# Patient Record
Sex: Female | Born: 1955 | Race: White | Hispanic: No | Marital: Married | Smoking: Never smoker
Health system: Southern US, Community
[De-identification: ages and names within clinical notes are randomized; demographics above are authoritative.]

## PROBLEM LIST (undated history)

## (undated) DIAGNOSIS — N393 Stress incontinence (female) (male): Secondary | ICD-10-CM

## (undated) HISTORY — DX: Stress incontinence (female) (male): N39.3

---

## 1997-08-15 ENCOUNTER — Ambulatory Visit (HOSPITAL_COMMUNITY): Admission: RE | Admit: 1997-08-15 | Discharge: 1997-08-15 | Payer: Self-pay | Admitting: Obstetrics and Gynecology

## 1998-07-22 ENCOUNTER — Other Ambulatory Visit: Admission: RE | Admit: 1998-07-22 | Discharge: 1998-07-22 | Payer: Self-pay | Admitting: Obstetrics and Gynecology

## 1999-08-13 ENCOUNTER — Other Ambulatory Visit: Admission: RE | Admit: 1999-08-13 | Discharge: 1999-08-13 | Payer: Self-pay | Admitting: Obstetrics and Gynecology

## 1999-10-01 ENCOUNTER — Ambulatory Visit (HOSPITAL_COMMUNITY): Admission: RE | Admit: 1999-10-01 | Discharge: 1999-10-01 | Payer: Self-pay | Admitting: Obstetrics and Gynecology

## 1999-10-01 ENCOUNTER — Encounter: Payer: Self-pay | Admitting: Obstetrics and Gynecology

## 2000-08-19 ENCOUNTER — Other Ambulatory Visit: Admission: RE | Admit: 2000-08-19 | Discharge: 2000-08-19 | Payer: Self-pay | Admitting: Obstetrics and Gynecology

## 2001-08-22 ENCOUNTER — Other Ambulatory Visit: Admission: RE | Admit: 2001-08-22 | Discharge: 2001-08-22 | Payer: Self-pay | Admitting: Obstetrics and Gynecology

## 2002-08-01 ENCOUNTER — Ambulatory Visit (HOSPITAL_COMMUNITY): Admission: RE | Admit: 2002-08-01 | Discharge: 2002-08-01 | Payer: Self-pay | Admitting: Obstetrics and Gynecology

## 2002-08-01 ENCOUNTER — Encounter: Payer: Self-pay | Admitting: Obstetrics and Gynecology

## 2002-09-15 ENCOUNTER — Other Ambulatory Visit: Admission: RE | Admit: 2002-09-15 | Discharge: 2002-09-15 | Payer: Self-pay | Admitting: Obstetrics and Gynecology

## 2003-10-22 ENCOUNTER — Other Ambulatory Visit: Admission: RE | Admit: 2003-10-22 | Discharge: 2003-10-22 | Payer: Self-pay | Admitting: Obstetrics and Gynecology

## 2003-10-26 ENCOUNTER — Ambulatory Visit (HOSPITAL_COMMUNITY): Admission: RE | Admit: 2003-10-26 | Discharge: 2003-10-26 | Payer: Self-pay | Admitting: Obstetrics and Gynecology

## 2004-11-03 ENCOUNTER — Other Ambulatory Visit: Admission: RE | Admit: 2004-11-03 | Discharge: 2004-11-03 | Payer: Self-pay | Admitting: Obstetrics and Gynecology

## 2005-09-29 ENCOUNTER — Other Ambulatory Visit: Admission: RE | Admit: 2005-09-29 | Discharge: 2005-09-29 | Payer: Self-pay | Admitting: Obstetrics and Gynecology

## 2006-09-13 ENCOUNTER — Ambulatory Visit (HOSPITAL_COMMUNITY): Admission: RE | Admit: 2006-09-13 | Discharge: 2006-09-13 | Payer: Self-pay | Admitting: Obstetrics and Gynecology

## 2006-10-05 ENCOUNTER — Other Ambulatory Visit: Admission: RE | Admit: 2006-10-05 | Discharge: 2006-10-05 | Payer: Self-pay | Admitting: Obstetrics and Gynecology

## 2007-10-10 ENCOUNTER — Ambulatory Visit (HOSPITAL_COMMUNITY): Admission: RE | Admit: 2007-10-10 | Discharge: 2007-10-10 | Payer: Self-pay | Admitting: Obstetrics and Gynecology

## 2007-10-10 ENCOUNTER — Other Ambulatory Visit: Admission: RE | Admit: 2007-10-10 | Discharge: 2007-10-10 | Payer: Self-pay | Admitting: Obstetrics and Gynecology

## 2008-10-11 ENCOUNTER — Other Ambulatory Visit: Admission: RE | Admit: 2008-10-11 | Discharge: 2008-10-11 | Payer: Self-pay | Admitting: Obstetrics and Gynecology

## 2008-10-23 ENCOUNTER — Ambulatory Visit (HOSPITAL_COMMUNITY): Admission: RE | Admit: 2008-10-23 | Discharge: 2008-10-23 | Payer: Self-pay | Admitting: Obstetrics and Gynecology

## 2008-12-27 ENCOUNTER — Ambulatory Visit: Payer: Self-pay | Admitting: Internal Medicine

## 2009-11-11 ENCOUNTER — Ambulatory Visit (HOSPITAL_COMMUNITY): Admission: RE | Admit: 2009-11-11 | Discharge: 2009-11-11 | Payer: Self-pay | Admitting: Obstetrics and Gynecology

## 2009-12-18 IMAGING — MG MM DIGITAL SCREENING BILAT
4 series · 4 of 4 positions shown · non-contrast
Comparison: none

DG SCREEN MAMMOGRAM BILATERAL
Bilateral CC and MLO view(s) were taken.

DIGITAL SCREENING MAMMOGRAM WITH CAD:
The breast tissue is extremely dense.  No masses or malignant type calcifications are identified.  
Compared with prior studies.

[R CC]
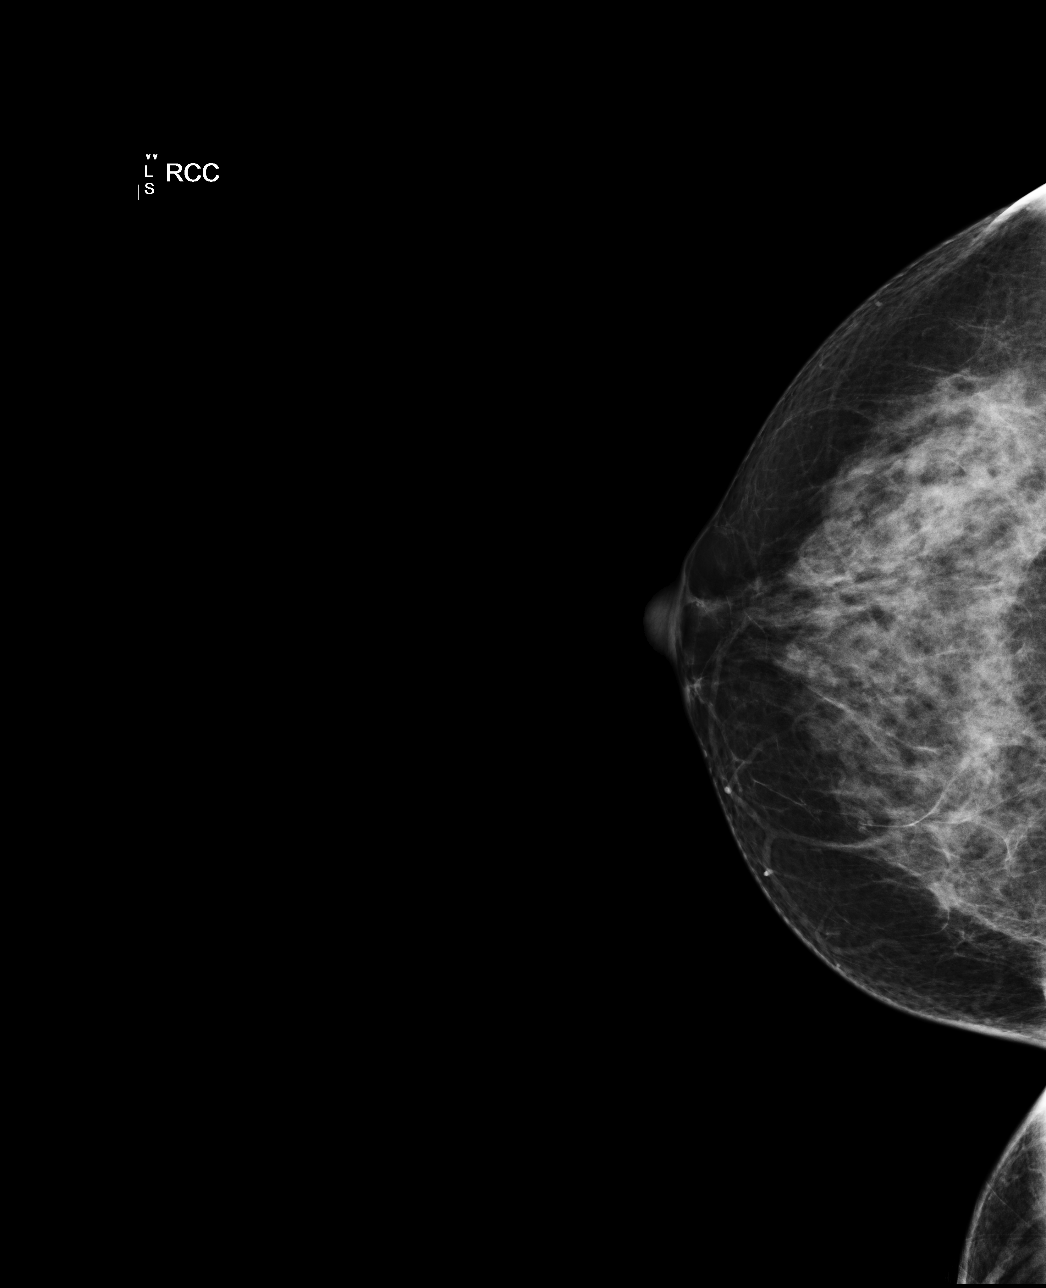

[R MLO]
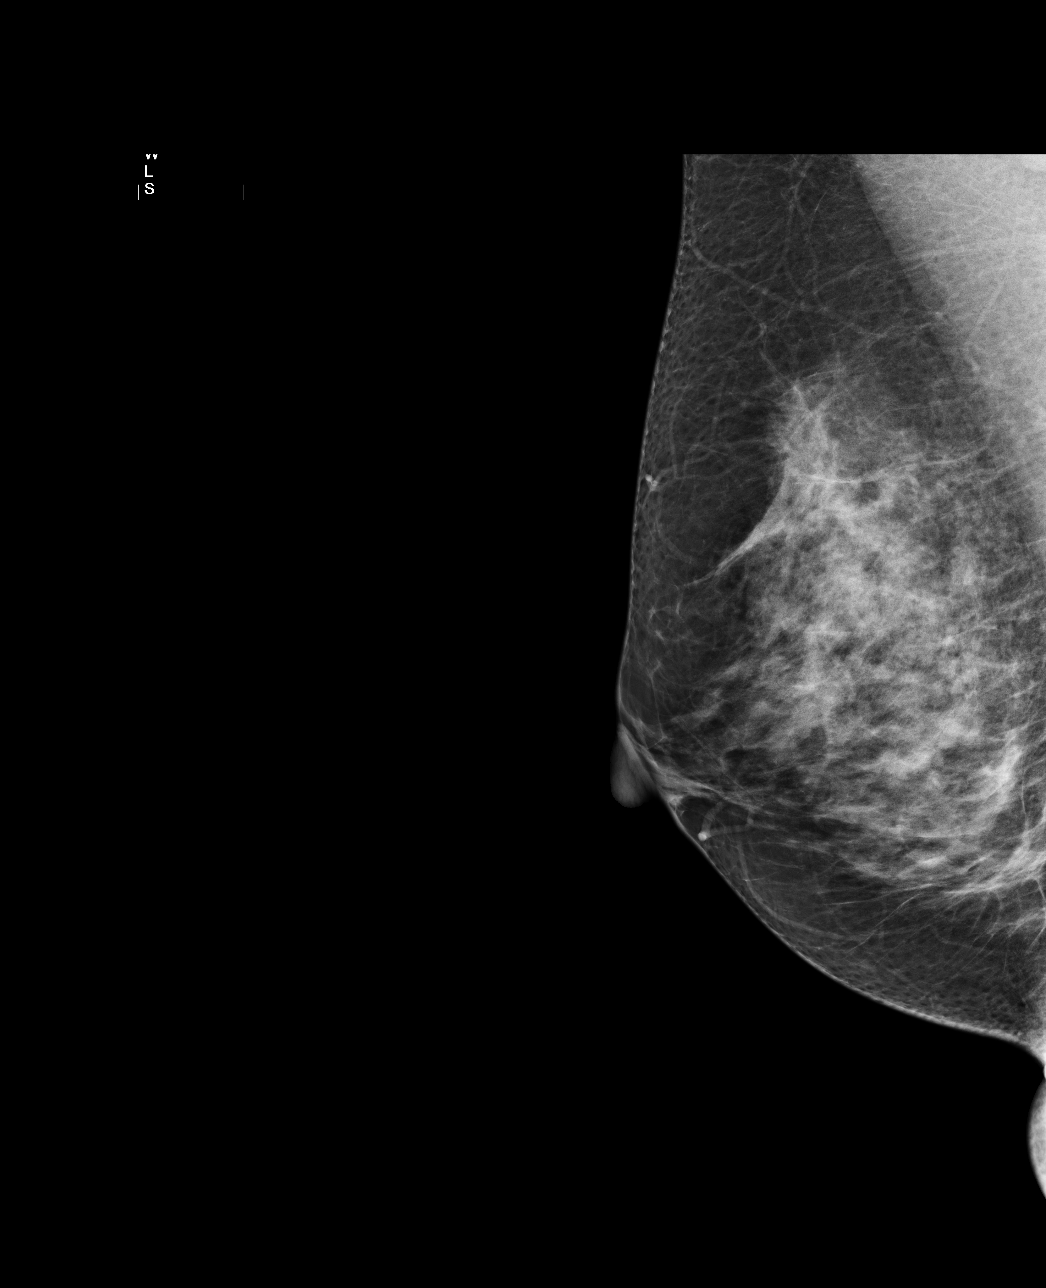

[L CC]
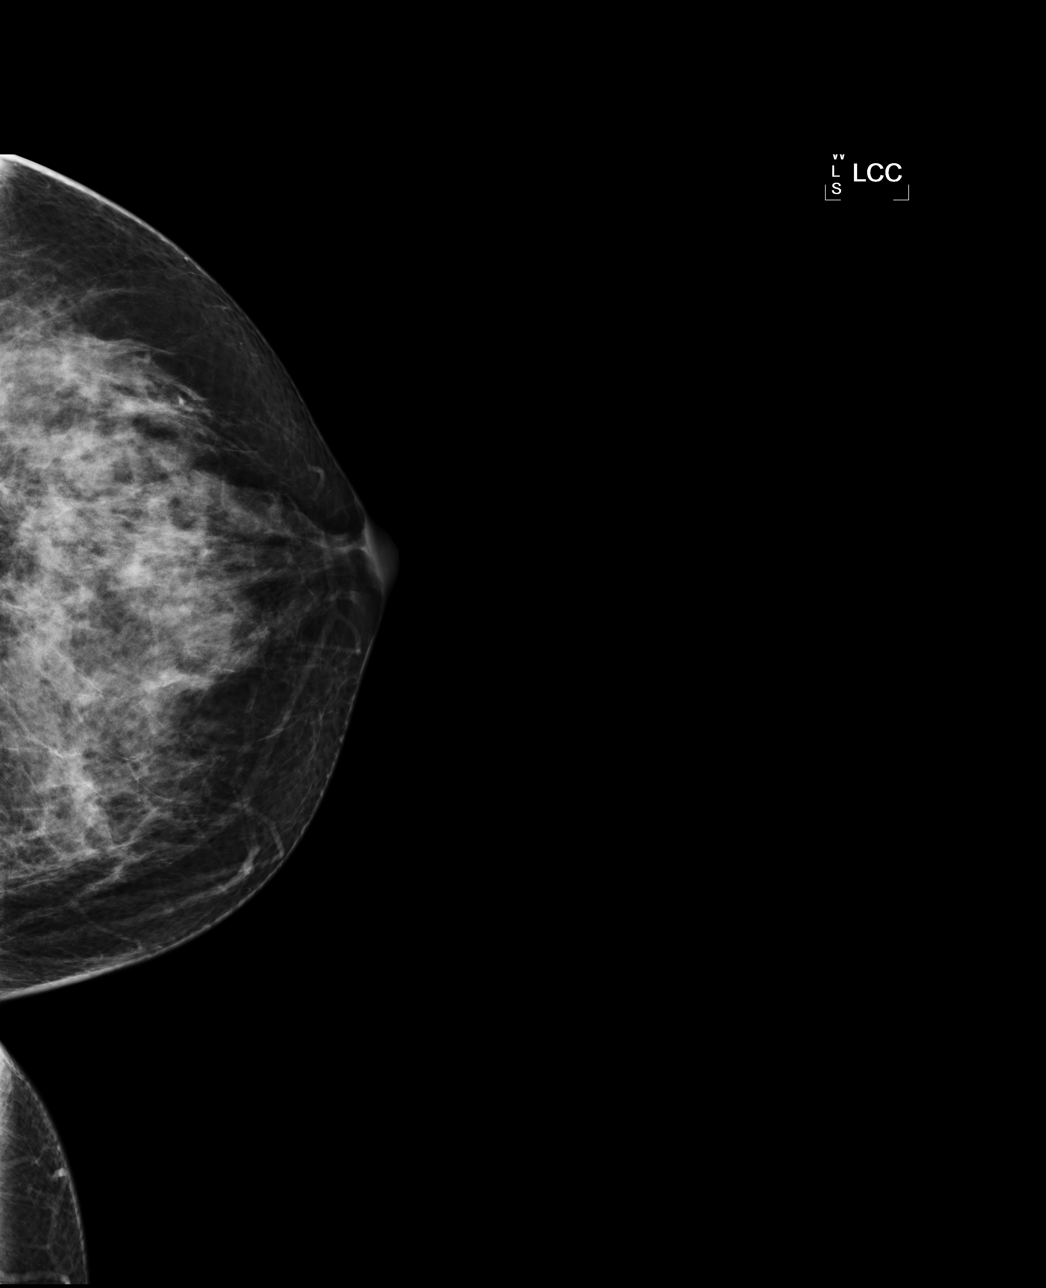

[L MLO]
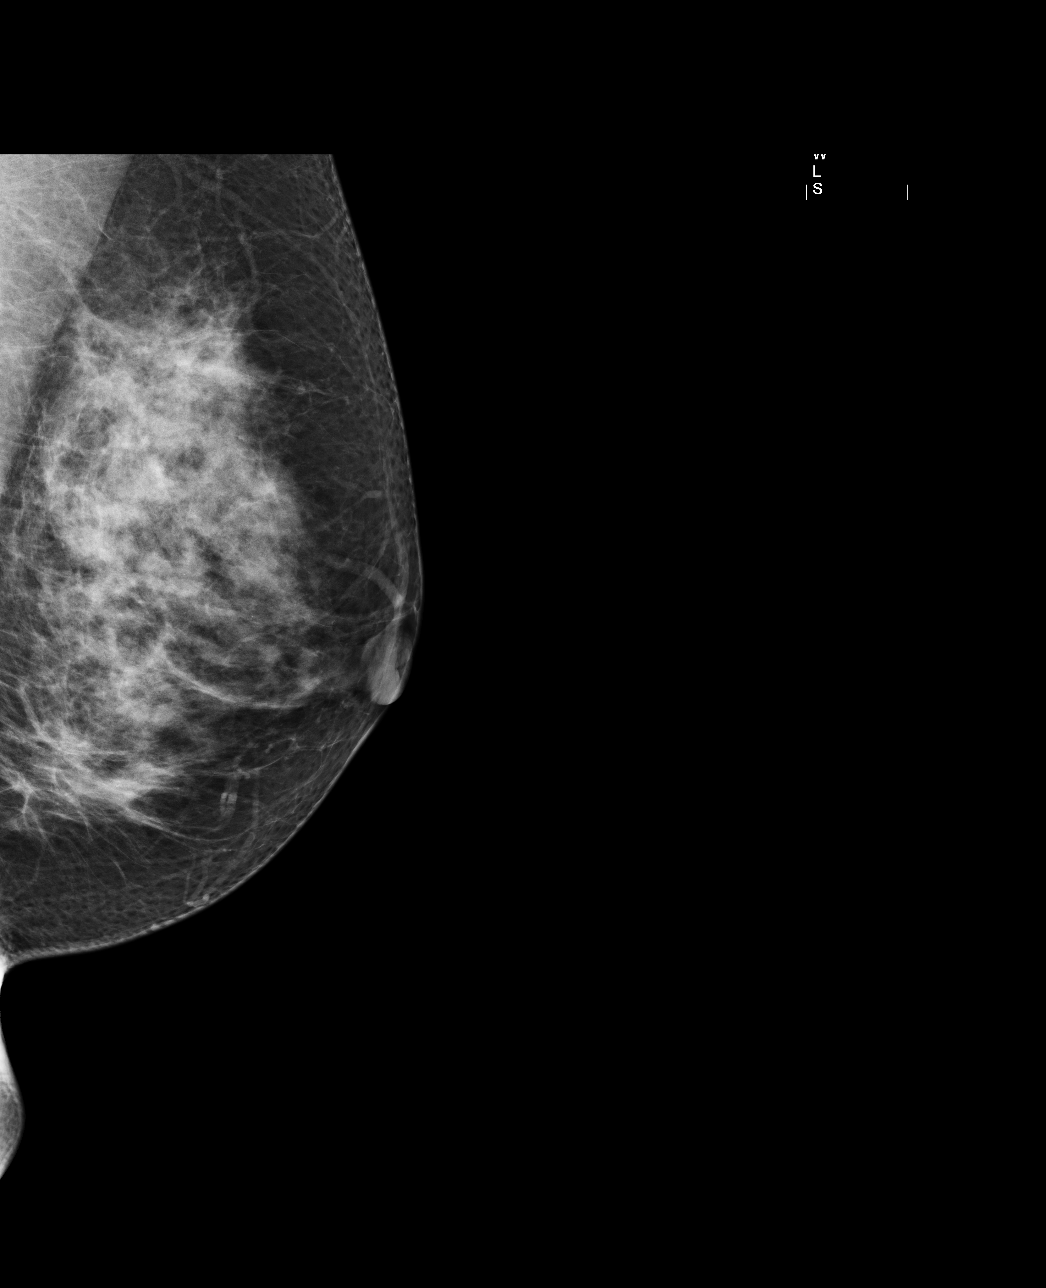

[4 of 4 positions shown; findings below may reference images not displayed]

IMPRESSION: No specific mammographic evidence of malignancy.  Next screening mammogram is recommended in one 
year.

A result letter of this screening mammogram will be mailed directly to the patient.

ASSESSMENT: Negative - BI-RADS 1

Screening mammogram in 1 year.
ANALYZED BY COMPUTER AIDED DETECTION. , THIS PROCEDURE WAS A DIGITAL MAMMOGRAM.

## 2010-10-23 ENCOUNTER — Other Ambulatory Visit: Payer: Self-pay | Admitting: Certified Nurse Midwife

## 2010-10-23 DIAGNOSIS — Z1231 Encounter for screening mammogram for malignant neoplasm of breast: Secondary | ICD-10-CM

## 2010-11-17 ENCOUNTER — Ambulatory Visit (HOSPITAL_COMMUNITY): Payer: Self-pay

## 2010-12-24 ENCOUNTER — Ambulatory Visit (HOSPITAL_COMMUNITY)
Admission: RE | Admit: 2010-12-24 | Discharge: 2010-12-24 | Disposition: A | Discharge status: Home / Self Care | Payer: BC Managed Care – PPO | Source: Ambulatory Visit | Attending: Certified Nurse Midwife | Admitting: Certified Nurse Midwife

## 2010-12-24 DIAGNOSIS — Z1231 Encounter for screening mammogram for malignant neoplasm of breast: Secondary | ICD-10-CM

## 2011-12-25 ENCOUNTER — Other Ambulatory Visit: Payer: Self-pay | Admitting: Certified Nurse Midwife

## 2011-12-25 DIAGNOSIS — Z1231 Encounter for screening mammogram for malignant neoplasm of breast: Secondary | ICD-10-CM

## 2012-01-18 ENCOUNTER — Ambulatory Visit (HOSPITAL_COMMUNITY)
Admission: RE | Admit: 2012-01-18 | Discharge: 2012-01-18 | Disposition: A | Discharge status: Home / Self Care | Payer: BC Managed Care – PPO | Source: Ambulatory Visit | Attending: Certified Nurse Midwife | Admitting: Certified Nurse Midwife

## 2012-01-18 DIAGNOSIS — Z1231 Encounter for screening mammogram for malignant neoplasm of breast: Secondary | ICD-10-CM | POA: Insufficient documentation

## 2012-12-27 ENCOUNTER — Encounter: Payer: Self-pay | Admitting: Certified Nurse Midwife

## 2012-12-29 ENCOUNTER — Ambulatory Visit: Payer: Self-pay | Admitting: Certified Nurse Midwife

## 2013-01-02 ENCOUNTER — Ambulatory Visit (INDEPENDENT_AMBULATORY_CARE_PROVIDER_SITE_OTHER): Payer: BC Managed Care – PPO | Admitting: Certified Nurse Midwife

## 2013-01-02 ENCOUNTER — Encounter: Payer: Self-pay | Admitting: Certified Nurse Midwife

## 2013-01-02 VITALS — BP 110/72 | HR 64 | Resp 16 | Ht 67.0 in | Wt 181.0 lb

## 2013-01-02 DIAGNOSIS — Z01419 Encounter for gynecological examination (general) (routine) without abnormal findings: Secondary | ICD-10-CM

## 2013-01-02 DIAGNOSIS — Z0000 Encounter for general adult medical examination without abnormal findings: Secondary | ICD-10-CM

## 2013-01-02 DIAGNOSIS — E559 Vitamin D deficiency, unspecified: Secondary | ICD-10-CM

## 2013-01-02 LAB — POCT URINALYSIS DIPSTICK
Bilirubin, UA: NEGATIVE
Ketones, UA: NEGATIVE
Leukocytes, UA: NEGATIVE

## 2013-01-02 LAB — COMPREHENSIVE METABOLIC PANEL
ALT: 28 U/L (ref 0–35)
AST: 22 U/L (ref 0–37)
Albumin: 4.3 g/dL (ref 3.5–5.2)
Calcium: 9.7 mg/dL (ref 8.4–10.5)
Chloride: 104 mEq/L (ref 96–112)
Potassium: 4.8 mEq/L (ref 3.5–5.3)

## 2013-01-02 LAB — LIPID PANEL: LDL Cholesterol: 141 mg/dL — ABNORMAL HIGH (ref 0–99)

## 2013-01-02 NOTE — Progress Notes (Signed)
57 y.o. Brittany Cohen Married Caucasian Fe here for annual exam. No menses in a year. Denies vaginal bleeding or vaginal dryness.  Denies night sweats, or hot flashes. Resolving rash from  ? sun or sunscreen, no itching. No new personal products.  Plans dermatology if doesn't resolve. No health issues today.  Patient's last menstrual period was 12/17/2010.          Sexually active: yes  The current method of family planning is none.    Exercising: yes  walking & weights Smoker:  no  Health Maintenance: Pap: 12-28-11 neg HPV HR neg MMG:  01-18-12 normal Colonoscopy:  04-08-12 BMD:   none TDaP:  11-03-07 Labs: Poct urine-neg, Hgb-13.4 Self breast exam: occ     Past Medical History  Diagnosis Date  . SUI (stress urinary incontinence, female)     No past surgical history on file.  Current Outpatient Prescriptions  Medication Sig Dispense Refill  . CALCIUM PO Take by mouth daily.      . Multiple Vitamins-Minerals (MULTIVITAMIN PO) Take by mouth daily.       No current facility-administered medications for this visit.    Family History  Problem Relation Age of Onset  . Cancer Sister     appendix cancer with ovarian metastasis    ROS:  Pertinent items are noted in HPI.  Otherwise, a comprehensive ROS was negative.  Exam:   LMP 12/17/2010    Ht Readings from Last 3 Encounters:  No data found for Ht    General appearance: alert, cooperative and appears stated age Head: Normocephalic, without obvious abnormality, atraumatic Neck: no adenopathy, supple, symmetrical, trachea midline and thyroid normal to inspection and palpation Lungs: clear to auscultation bilaterally Breasts: normal appearance, no masses or tenderness, No nipple retraction or dimpling, No nipple discharge or bleeding, No axillary or supraclavicular adenopathy Heart: regular rate and rhythm Abdomen: soft, non-tender; no masses,  no organomegaly Extremities: extremities normal, atraumatic, no cyanosis or edema Skin:  Skin color, texture, turgor normal.fine scattered pink rash, no macules or papules, no exudate or lesions Lymph nodes: Cervical, supraclavicular, and axillary nodes normal. No abnormal inguinal nodes palpated Neurologic: Grossly normal   Pelvic: External genitalia:  no lesions              Urethra:  normal appearing urethra with no masses, tenderness or lesions              Bartholin's and Skene's: normal                 Vagina: normal appearing vagina with normal color and discharge, no lesions              Cervix: normal, non tender              Pap taken: no Bimanual Exam:  Uterus:  normal size, contour, position, consistency, mobility, non-tender and anteverted              Adnexa: normal adnexa and no mass, fullness, tenderness               Rectovaginal: Confirms               Anus:  normal sphincter tone, no lesions  A:  Well Woman with normal exam  Menopausal, no HRT  Skin rash resolving, probable chemical reaction with sunscreen  History of Vitamin D deficiency  P: Reviewed health and wellness pertinent to exam  Fasting labs: CMP,Lipid panel, TSH    Vitamin D  Encouraged  to use aveeno bath to see if rash will resolve, if not dermatology visit  Pap smear as per guidelines   Mammogram yearly pap smear not taken today.  counseled on breast self exam, mammography screening, menopause, adequate intake of calcium and vitamin D, diet and exercise, Kegel's exercises  return annually or prn  An After Visit Summary was printed and given to the patient.  Reviewed, TL

## 2013-01-02 NOTE — Patient Instructions (Addendum)

## 2013-01-03 ENCOUNTER — Other Ambulatory Visit: Payer: Self-pay | Admitting: Certified Nurse Midwife

## 2013-01-03 ENCOUNTER — Telehealth: Payer: Self-pay

## 2013-01-03 DIAGNOSIS — R6889 Other general symptoms and signs: Secondary | ICD-10-CM

## 2013-01-03 DIAGNOSIS — Z1231 Encounter for screening mammogram for malignant neoplasm of breast: Secondary | ICD-10-CM

## 2013-01-03 LAB — HEMOGLOBIN, FINGERSTICK: Hemoglobin, fingerstick: 13.4 g/dL (ref 12.0–16.0)

## 2013-01-03 LAB — VITAMIN D 25 HYDROXY (VIT D DEFICIENCY, FRACTURES): Vit D, 25-Hydroxy: 45 ng/mL (ref 30–89)

## 2013-01-03 NOTE — Telephone Encounter (Signed)
Message copied by Eliezer Bottom on Tue Jan 03, 2013  1:14 PM ------      Message from: Verner Chol      Created: Tue Jan 03, 2013  8:10 AM       Notify patient cholesterol borderline high 227 and LDL cholesterol borderline high(bad cholesterol), HDL(good cholesterol is normal)      Need to work on weight loss by watching portion control and decreasing packaged and processed, fried foods. I know she can do this!      Thyroid normal , liver, kidney,glucose profile normal      Vitamin D protocol      Recheck cholesterol 6 months(order in) ------

## 2013-01-03 NOTE — Telephone Encounter (Signed)
Lmtcb//jj 

## 2013-01-05 ENCOUNTER — Telehealth: Payer: Self-pay

## 2013-01-05 NOTE — Telephone Encounter (Signed)
lmtcb

## 2013-01-05 NOTE — Telephone Encounter (Signed)
Patient notified

## 2013-01-05 NOTE — Telephone Encounter (Signed)
Message copied by Eliezer Bottom on Thu Jan 05, 2013 10:47 AM ------      Message from: Verner Chol      Created: Tue Jan 03, 2013  8:10 AM       Notify patient cholesterol borderline high 227 and LDL cholesterol borderline high(bad cholesterol), HDL(good cholesterol is normal)      Need to work on weight loss by watching portion control and decreasing packaged and processed, fried foods. I know she can do this!      Thyroid normal , liver, kidney,glucose profile normal      Vitamin D protocol      Recheck cholesterol 6 months(order in) ------

## 2013-01-23 ENCOUNTER — Ambulatory Visit (HOSPITAL_COMMUNITY)
Admission: RE | Admit: 2013-01-23 | Discharge: 2013-01-23 | Disposition: A | Discharge status: Home / Self Care | Payer: BC Managed Care – PPO | Source: Ambulatory Visit | Attending: Certified Nurse Midwife | Admitting: Certified Nurse Midwife

## 2013-01-23 DIAGNOSIS — Z1231 Encounter for screening mammogram for malignant neoplasm of breast: Secondary | ICD-10-CM | POA: Insufficient documentation

## 2013-06-21 ENCOUNTER — Telehealth: Payer: Self-pay | Admitting: Certified Nurse Midwife

## 2013-06-21 NOTE — Telephone Encounter (Signed)
Patient calling re: she got an appointment reminder call for and appointment 06/26/13 for recheck lab. Patient does not know why she has this appointment. She states she had all her labs done at her [facility] this past June 2014. Please advise?

## 2013-06-21 NOTE — Telephone Encounter (Signed)
Patient wants to cancel her appt for Monday and will call back for after the holidays is this ok?

## 2013-06-21 NOTE — Telephone Encounter (Signed)
This is a 6 month fasting lab draw for recheck of cholesterol.   Message left to return call to [facility] at [phone].

## 2013-06-22 NOTE — Telephone Encounter (Signed)
Spoke with patient. Advised this was a 6 month recheck due to elevated cholesterol 6 months ago. Patient would like to cancel for now and call back after holidays to schedule. Appointment cancelled. Patient will call back.

## 2013-06-26 ENCOUNTER — Other Ambulatory Visit: Payer: BC Managed Care – PPO

## 2013-12-06 ENCOUNTER — Encounter: Payer: Self-pay | Admitting: Podiatrist

## 2013-12-06 ENCOUNTER — Ambulatory Visit (INDEPENDENT_AMBULATORY_CARE_PROVIDER_SITE_OTHER): Payer: BC Managed Care – PPO | Admitting: Podiatrist

## 2013-12-06 ENCOUNTER — Ambulatory Visit (INDEPENDENT_AMBULATORY_CARE_PROVIDER_SITE_OTHER): Payer: BC Managed Care – PPO

## 2013-12-06 VITALS — BP 145/92 | HR 64 | Resp 16 | Ht 67.0 in | Wt 175.0 lb

## 2013-12-06 DIAGNOSIS — M722 Plantar fascial fibromatosis: Secondary | ICD-10-CM

## 2013-12-06 NOTE — Progress Notes (Signed)
Right plantar heel pain , its been 2 months with the pain .  Chief Complaint  Patient presents with  . Foot Pain    right heel pain      HPI: Patient is 58 y.o. female who presents today for heel pain on the right foot.  She underwent extensive conservative therapy on the left heel and finally Sharen Hones was able to make her better with PT. She tried PT on the right but hasn't improved.  John sent her back to be evaluated for plantar fasciitis on the right foot.      Physical Exam GENERAL APPEARANCE: Alert, conversant. Appropriately groomed. No acute distress.  VASCULAR: Pedal pulses palpable and strong bilateral.  Capillary refill time is immediate to all digits,  Proximal to distal cooling it warm to warm.  Digital hair growth is present bilateral  NEUROLOGIC: sensation is intact epicritically and protectively to 5.07 monofilament at 5/5 sites bilateral.  Light touch is intact bilateral, vibratory sensation intact bilateral, achilles tendon reflex is intact bilateral.  Negative Tinel sign is elicited MUSCULOSKELETAL: Pain on palpation plantar medial aspect right foot  at insertion of plantar fascia on the medial calcaneal tubercle. Inflammation at the insertion of the plantar fascia is present. Rectus foot type is seen. DERMATOLOGIC: skin color, texture, and turger are within normal limits.  No preulcerative lesions are seen, no interdigital maceration noted.  No open lesions present.  Digital nails are asymptomatic.     Assessment: plantar fasciitis right  Plan:Discussed etiology, pathology, conservative vs. Surgical therapies and at this time a plantar fascial injection was recommended.  The patient agreed and a sterile skin prep was applied.  An injection consisting of kenalog and marcaine mixture was infiltrated at the point of maximal tenderness on the right Heel.  The patient tolerated this well and was given instructions for aftercare. She will continue with her stretches, night  splint, and orthotic wear.  i will see her back if the injection is not beneficial

## 2013-12-06 NOTE — Patient Instructions (Signed)

## 2013-12-22 ENCOUNTER — Ambulatory Visit: Payer: BC Managed Care – PPO | Admitting: Podiatrist

## 2014-01-01 ENCOUNTER — Other Ambulatory Visit: Payer: Self-pay | Admitting: Certified Nurse Midwife

## 2014-01-01 DIAGNOSIS — Z1231 Encounter for screening mammogram for malignant neoplasm of breast: Secondary | ICD-10-CM

## 2014-01-03 ENCOUNTER — Ambulatory Visit: Payer: BC Managed Care – PPO | Admitting: Podiatrist

## 2014-01-03 ENCOUNTER — Encounter: Payer: Self-pay | Admitting: Certified Nurse Midwife

## 2014-01-03 ENCOUNTER — Ambulatory Visit (INDEPENDENT_AMBULATORY_CARE_PROVIDER_SITE_OTHER): Payer: BC Managed Care – PPO | Admitting: Certified Nurse Midwife

## 2014-01-03 VITALS — BP 116/70 | HR 72 | Resp 16 | Ht 66.75 in | Wt 180.0 lb

## 2014-01-03 DIAGNOSIS — Z01419 Encounter for gynecological examination (general) (routine) without abnormal findings: Secondary | ICD-10-CM

## 2014-01-03 DIAGNOSIS — Z124 Encounter for screening for malignant neoplasm of cervix: Secondary | ICD-10-CM

## 2014-01-03 DIAGNOSIS — Z0000 Encounter for general adult medical examination without abnormal findings: Secondary | ICD-10-CM

## 2014-01-03 LAB — POCT URINALYSIS DIPSTICK
BILIRUBIN UA: NEGATIVE
Blood, UA: NEGATIVE
Glucose, UA: NEGATIVE
KETONES UA: NEGATIVE
LEUKOCYTES UA: NEGATIVE
Nitrite, UA: NEGATIVE
PH UA: 5
PROTEIN UA: NEGATIVE
Urobilinogen, UA: NEGATIVE

## 2014-01-03 LAB — HEMOGLOBIN, FINGERSTICK: Hemoglobin, fingerstick: 13.6 g/dL (ref 12.0–16.0)

## 2014-01-03 NOTE — Progress Notes (Signed)
58 y.o. [id number] Married Caucasian Fe here for annual exam. Menopausal no HRT. Denies vaginal bleeding or vaginal dryness. Working on exercise and diet due to cholesterol elevation. Brittany Cohen. Sees PCP prn. Sister who had cancer of the appendix had recurrence in abdomen and now is recovering, so stress with concerns for her. No health issues today.   Patient's last menstrual period was 02/11/2011.          Sexually active: yes  The current method of family planning is post menopausal status.    Exercising: yes  sporadic Smoker:  no  Health Maintenance: Pap: 12-28-11 neg HPV HR neg MMG: 01-23-13 normal Colonoscopy:  04-08-12 negative 10 years BMD:   none TDaP:  2009 Labs: Poct urine-neg, Hgb-13.6 Self breast exam: done occ   reports that she has never smoked. She does not have any smokeless tobacco history on file. She reports that she drinks about one ounce of alcohol per week. She reports that she does not use illicit drugs.  Past Medical History  Diagnosis Date  . SUI (stress urinary incontinence, female)     History reviewed. No pertinent past surgical history.  Current Outpatient Prescriptions  Medication Sig Dispense Refill  . CALCIUM PO Take by mouth daily. + D      . Multiple Vitamins-Minerals (MULTIVITAMIN PO) Take by mouth daily.       . Omega-3 Fatty Acids (FISH OIL PO) Take by mouth daily.      . Vitamin D, Ergocalciferol, (DRISDOL) 50000 UNITS CAPS every 7 (seven) days.       No current facility-administered medications for this visit.    Family History  Problem Relation Age of Onset  . Cancer Sister     appendix cancer   . Hyperlipidemia Mother     ROS:  Pertinent items are noted in HPI.  Otherwise, a comprehensive ROS was negative.  Exam:   BP 116/70  Pulse 72  Resp 16  Ht 5' 6.75" (1.695 m)  Wt 180 lb (81.647 kg)  BMI 28.42 kg/m2  LMP 02/11/2011 Height: 5' 6.75" (169.5 cm)  Ht Readings from Last 3 Encounters:  01/03/14 5' 6.75" (1.695 m)  12/06/13 5\' 7"   (1.702 m)  01/02/13 5\' 7"  (1.702 m)    General appearance: alert, cooperative and appears stated age Head: Normocephalic, without obvious abnormality, atraumatic Neck: no adenopathy, supple, symmetrical, trachea midline and thyroid normal to inspection and palpation and non-palpable Lungs: clear to auscultation bilaterally Breasts: normal appearance, no masses or tenderness, No nipple retraction or dimpling, No nipple discharge or bleeding, No axillary or supraclavicular adenopathy Heart: regular rate and rhythm Abdomen: soft, non-tender; no masses,  no organomegaly Extremities: extremities normal, atraumatic, no cyanosis or edema Skin: Skin color, texture, turgor normal. No rashes or lesions Lymph nodes: Cervical, supraclavicular, and axillary nodes normal. No abnormal inguinal nodes palpated Neurologic: Grossly normal   Pelvic: External genitalia:  no lesions              Urethra:  normal appearing urethra with no masses, tenderness or lesions              Bartholin's and Skene's: normal                 Vagina: normal appearing vagina with normal color and discharge, no lesions              Cervix: normal, non tender              Pap taken: yes Bimanual  Exam:  Uterus:  normal size, contour, position, consistency, mobility, non-tender and mid position              Adnexa: normal adnexa and no mass, fullness, tenderness               Rectovaginal: Confirms               Anus:  normal sphincter tone, no lesions  A:  Well Woman with normal exam  Menopausal no HRT  Elevated cholesterol with diet and exercise management, re check today  P:   Reviewed health and wellness pertinent to exam  Pap smear taken today with HPV reflex  Continue to work on diet/exercise and weight loss to control. Will advise of recommendations when lab reviewed.  Lab: Lipid panel fasting   counseled on breast self exam, mammography screening, menopause, adequate intake of calcium and vitamin D, diet and  exercise  return annually or prn  An After Visit Summary was printed and given to the patient.

## 2014-01-03 NOTE — Patient Instructions (Signed)

## 2014-01-05 LAB — IPS PAP TEST WITH REFLEX TO HPV

## 2014-01-08 NOTE — Progress Notes (Signed)
Reviewed personally.  M. Suzanne Miller, MD.  

## 2014-01-31 ENCOUNTER — Ambulatory Visit (HOSPITAL_COMMUNITY)
Admission: RE | Admit: 2014-01-31 | Discharge: 2014-01-31 | Disposition: A | Discharge status: Home / Self Care | Payer: BC Managed Care – PPO | Source: Ambulatory Visit | Attending: Certified Nurse Midwife | Admitting: Certified Nurse Midwife

## 2014-01-31 DIAGNOSIS — Z1231 Encounter for screening mammogram for malignant neoplasm of breast: Secondary | ICD-10-CM | POA: Insufficient documentation

## 2014-03-30 ENCOUNTER — Encounter: Payer: Self-pay | Admitting: Podiatrist

## 2014-03-30 ENCOUNTER — Ambulatory Visit (INDEPENDENT_AMBULATORY_CARE_PROVIDER_SITE_OTHER): Payer: BC Managed Care – PPO

## 2014-03-30 ENCOUNTER — Ambulatory Visit (INDEPENDENT_AMBULATORY_CARE_PROVIDER_SITE_OTHER): Payer: BC Managed Care – PPO | Admitting: Podiatrist

## 2014-03-30 VITALS — BP 159/89 | HR 63 | Resp 14 | Ht 67.0 in | Wt 180.0 lb

## 2014-03-30 DIAGNOSIS — M722 Plantar fascial fibromatosis: Secondary | ICD-10-CM

## 2014-03-30 DIAGNOSIS — M779 Enthesopathy, unspecified: Secondary | ICD-10-CM

## 2014-03-30 NOTE — Progress Notes (Signed)
Subjective: Patient presents today for continued heel pain on the right foot. She states it significantly improved at the last injection however it never completely resolved. She states she went to 4 mile walk and put her significantly however it started to decrease in its discomfort over the last couple of days. She also relates in the left heel is completely healed and she's happy with his progress.  Physical Exam  Neurovascular status is intact and unchanged. Chronic plantar fasciitis symptomatology is elicited plantar medial aspect of the right heel.   Assessment: plantar fasciitis right   Plan: She is using all conservative treatments including shoe gear changes, stretching, physical therapy at home, icing and at this time I recommended a second steroid injection into the plantar right heel. The  patient agreed and a sterile skin prep was applied. An injection consisting of kenalog and marcaine mixture was infiltrated at the point of maximal tenderness on the right Heel. The patient tolerated this well and was given instructions for aftercare. She will continue with her stretches, night splint, and orthotic wear. i will see her back if the injection is not beneficial

## 2014-03-30 NOTE — Progress Notes (Signed)
   Subjective:    Patient ID: Brittany Cohen, female    DOB: Nov 08, 1955, 58 y.o.   MRN: [id number]  HPI Comments: Pt walked 4 miles on Monday and began to have severe pain in right heel, inferior arch and up latera posterior heel.  Pt states she took Ibuprofen, ice massage and stretches with some relief.     Review of Systems  All other systems reviewed and are negative.      Objective:   Physical Exam        Assessment & Plan:

## 2014-05-14 ENCOUNTER — Encounter: Payer: Self-pay | Admitting: Podiatrist

## 2015-01-10 ENCOUNTER — Ambulatory Visit (INDEPENDENT_AMBULATORY_CARE_PROVIDER_SITE_OTHER): Payer: BC Managed Care – PPO | Admitting: Certified Nurse Midwife

## 2015-01-10 ENCOUNTER — Encounter: Payer: Self-pay | Admitting: Certified Nurse Midwife

## 2015-01-10 VITALS — BP 120/74 | HR 70 | Resp 20 | Ht 66.75 in | Wt 181.0 lb

## 2015-01-10 DIAGNOSIS — Z0000 Encounter for general adult medical examination without abnormal findings: Secondary | ICD-10-CM

## 2015-01-10 DIAGNOSIS — Z01419 Encounter for gynecological examination (general) (routine) without abnormal findings: Secondary | ICD-10-CM

## 2015-01-10 LAB — POCT URINALYSIS DIPSTICK
Bilirubin, UA: NEGATIVE
Blood, UA: NEGATIVE
GLUCOSE UA: NEGATIVE
Ketones, UA: NEGATIVE
Leukocytes, UA: NEGATIVE
Nitrite, UA: NEGATIVE
Protein, UA: NEGATIVE
UROBILINOGEN UA: NEGATIVE
pH, UA: 5

## 2015-01-10 NOTE — Progress Notes (Signed)
59 y.o. [id number] Married  Caucasian Fe here for annual exam.  Post menopausal no vaginal bleeding or vaginal dryness. Sees [facility] prn. Fasted for screening labs today. No health issues in past year. Sister with stomach cancer is now doing well!  Patient's last menstrual period was 02/11/2011.          Sexually active: Yes.    The current method of family planning is post menopausal status.    Exercising: Yes.    walking Smoker:  no  Health Maintenance: Pap: 01-03-14 neg MMG: 01-31-14 category c density, birads 1:neg Colonoscopy:  2013 neg f/u 31yrs BMD:   none TDaP:  2009 Labs: Poct urine-neg, Hgb-13.4 Self breast exam: done occ   reports that she has never smoked. She does not have any smokeless tobacco history on file. She reports that she drinks about 1.2 oz of alcohol per week. She reports that she does not use illicit drugs.  Past Medical History  Diagnosis Date  . SUI (stress urinary incontinence, female)     History reviewed. No pertinent past surgical history.  Current Outpatient Prescriptions  Medication Sig Dispense Refill  . CALCIUM PO Take by mouth daily. + D    . Multiple Vitamins-Minerals (MULTIVITAMIN PO) Take by mouth daily.     . Omega-3 Fatty Acids (FISH OIL PO) Take by mouth daily.     No current facility-administered medications for this visit.    Family History  Problem Relation Age of Onset  . Cancer Sister     appendix cancer   . Hyperlipidemia Mother     ROS:  Pertinent items are noted in HPI.  Otherwise, a comprehensive ROS was negative.  Exam:   BP 120/74 mmHg  Pulse 70  Resp 20  Ht 5' 6.75" (1.695 m)  Wt 181 lb (82.101 kg)  BMI 28.58 kg/m2  LMP 02/11/2011 Height: 5' 6.75" (169.5 cm) Ht Readings from Last 3 Encounters:  01/10/15 5' 6.75" (1.695 m)  03/30/14  (1.702 m)  01/03/14 5' 6.75" (1.695 m)    General appearance: alert, cooperative and appears stated age Head: Normocephalic, without obvious abnormality, atraumatic Neck:  no adenopathy, supple, symmetrical, trachea midline and thyroid normal to inspection and palpation Lungs: clear to auscultation bilaterally Breasts: normal appearance, no masses or tenderness, No nipple retraction or dimpling, No nipple discharge or bleeding, No axillary or supraclavicular adenopathy Heart: regular rate and rhythm Abdomen: soft, non-tender; no masses,  no organomegaly Extremities: extremities normal, atraumatic, no cyanosis or edema Skin: Skin color, texture, turgor normal. No rashes or lesions Lymph nodes: Cervical, supraclavicular, and axillary nodes normal. No abnormal inguinal nodes palpated Neurologic: Grossly normal   Pelvic: External genitalia:  no lesions              Urethra:  normal appearing urethra with no masses, tenderness or lesions              Bartholin's and Skene's: normal                 Vagina: normal appearing vagina with normal color and discharge, no lesions              Cervix: normal,non tender no lesions              Pap taken: No. Bimanual Exam:  Uterus:  normal size, contour, position, consistency, mobility, non-tender              Adnexa: normal adnexa and no mass, fullness, tenderness  Rectovaginal: Confirms               Anus:  normal sphincter tone, no lesions  Chaperone present: Yes  A:  Well Woman with normal exam  Menopausal no HRT  Screening labs  P:   Reviewed health and wellness pertinent to exam  Aware of need to evaluate if vaginal bleeding  Labs: TSH, Lipid panel, Vitamin D, CBC,CMP  Pap smear not taken   counseled on breast self exam, mammography screening, adequate intake of calcium and vitamin D, diet and exercise  return annually or prn  An After Visit Summary was printed and given to the patient.

## 2015-01-10 NOTE — Patient Instructions (Signed)

## 2015-01-11 ENCOUNTER — Other Ambulatory Visit: Payer: Self-pay | Admitting: Certified Nurse Midwife

## 2015-01-11 DIAGNOSIS — Z1231 Encounter for screening mammogram for malignant neoplasm of breast: Secondary | ICD-10-CM

## 2015-01-11 LAB — LIPID PANEL
Cholesterol: 212 mg/dL — ABNORMAL HIGH (ref 0–200)
HDL: 58 mg/dL (ref >=46)
LDL Cholesterol: 137 mg/dL — ABNORMAL HIGH (ref 0–99)
TRIGLYCERIDES: 83 mg/dL (ref <150)
Total CHOL/HDL Ratio: 3.7 Ratio
VLDL: 17 mg/dL (ref 0–40)

## 2015-01-11 LAB — COMPREHENSIVE METABOLIC PANEL
ALBUMIN: 4.1 g/dL (ref 3.5–5.2)
ALK PHOS: 62 U/L (ref 39–117)
ALT: 19 U/L (ref 0–35)
AST: 14 U/L (ref 0–37)
BUN: 14 mg/dL (ref 6–23)
CO2: 28 meq/L (ref 19–32)
Calcium: 9.1 mg/dL (ref 8.4–10.5)
Chloride: 106 mEq/L (ref 96–112)
Creat: 0.67 mg/dL (ref 0.50–1.10)
Glucose, Bld: 93 mg/dL (ref 70–99)
Potassium: 4.8 mEq/L (ref 3.5–5.3)
SODIUM: 144 meq/L (ref 135–145)
TOTAL PROTEIN: 6.3 g/dL (ref 6.0–8.3)
Total Bilirubin: 0.6 mg/dL (ref 0.2–1.2)

## 2015-01-11 LAB — CBC
HCT: 39.9 % (ref 36.0–46.0)
Hemoglobin: 13.2 g/dL (ref 12.0–15.0)
MCH: 29.3 pg (ref 26.0–34.0)
MCHC: 33.1 g/dL (ref 30.0–36.0)
MCV: 88.5 fL (ref 78.0–100.0)
MPV: 9.5 fL (ref 8.6–12.4)
PLATELETS: 265 10*3/uL (ref 150–400)
RBC: 4.51 MIL/uL (ref 3.87–5.11)
RDW: 13.3 % (ref 11.5–15.5)
WBC: 6 10*3/uL (ref 4.0–10.5)

## 2015-01-11 LAB — TSH: TSH: 1.052 u[IU]/mL (ref 0.350–4.500)

## 2015-01-11 LAB — VITAMIN D 25 HYDROXY (VIT D DEFICIENCY, FRACTURES): Vit D, 25-Hydroxy: 36 ng/mL (ref 30–100)

## 2015-01-15 LAB — HEMOGLOBIN, FINGERSTICK: Hemoglobin, fingerstick: 13.4 g/dL (ref 12.0–16.0)

## 2015-01-15 NOTE — Progress Notes (Signed)
Reviewed personally.  M. Suzanne Aeliana Spates, MD.  

## 2015-02-12 ENCOUNTER — Ambulatory Visit (HOSPITAL_COMMUNITY): Payer: BC Managed Care – PPO

## 2015-02-19 ENCOUNTER — Ambulatory Visit (HOSPITAL_COMMUNITY): Payer: BC Managed Care – PPO

## 2015-02-28 ENCOUNTER — Ambulatory Visit (HOSPITAL_COMMUNITY)
Admission: RE | Admit: 2015-02-28 | Discharge: 2015-02-28 | Disposition: A | Discharge status: Home / Self Care | Payer: BC Managed Care – PPO | Source: Ambulatory Visit | Attending: Certified Nurse Midwife | Admitting: Certified Nurse Midwife

## 2015-02-28 DIAGNOSIS — Z1231 Encounter for screening mammogram for malignant neoplasm of breast: Secondary | ICD-10-CM

## 2016-01-16 ENCOUNTER — Ambulatory Visit (INDEPENDENT_AMBULATORY_CARE_PROVIDER_SITE_OTHER): Payer: BC Managed Care – PPO | Admitting: Certified Nurse Midwife

## 2016-01-16 ENCOUNTER — Encounter: Payer: Self-pay | Admitting: Certified Nurse Midwife

## 2016-01-16 VITALS — BP 110/68 | HR 72 | Resp 16 | Ht 66.5 in | Wt 181.0 lb

## 2016-01-16 DIAGNOSIS — Z124 Encounter for screening for malignant neoplasm of cervix: Secondary | ICD-10-CM

## 2016-01-16 DIAGNOSIS — Z01419 Encounter for gynecological examination (general) (routine) without abnormal findings: Secondary | ICD-10-CM

## 2016-01-16 DIAGNOSIS — Z0000 Encounter for general adult medical examination without abnormal findings: Secondary | ICD-10-CM

## 2016-01-16 LAB — POCT URINALYSIS DIPSTICK
BILIRUBIN UA: NEGATIVE
GLUCOSE UA: NEGATIVE
Ketones, UA: NEGATIVE
Leukocytes, UA: NEGATIVE
Nitrite, UA: NEGATIVE
Protein, UA: NEGATIVE
RBC UA: NEGATIVE
UROBILINOGEN UA: NEGATIVE
pH, UA: 5

## 2016-01-16 LAB — LIPID PANEL
Cholesterol: 218 mg/dL — ABNORMAL HIGH (ref 125–200)
HDL: 66 mg/dL (ref >=46)
LDL Cholesterol: 130 mg/dL — ABNORMAL HIGH (ref <130)
Total CHOL/HDL Ratio: 3.3 Ratio (ref <=5.0)
Triglycerides: 109 mg/dL (ref <150)
VLDL: 22 mg/dL (ref <30)

## 2016-01-16 LAB — HEMOGLOBIN, FINGERSTICK: Hemoglobin, fingerstick: 13.2 g/dL (ref 12.0–16.0)

## 2016-01-16 NOTE — Progress Notes (Signed)
60 y.o. [id number] Married  Caucasian Fe here for annual exam. Menopausal no HRT. Denies vaginal bleeding or vaginal dryness.  See PCP prn. No health issue today. Screening labs desired.   Patient's last menstrual period was 02/11/2011.          Sexually active: Yes.    The current method of family planning is post menopausal status.    Exercising: Yes.    walking Smoker:  no  Health Maintenance: Pap:  01-03-14 neg MMG: 02-28-15 category c density, birads 1:neg Colonoscopy:  2013 neg f/u 5771yrs BMD:   none TDaP:  2009 Shingles: no Pneumonia: no Hep C and MVH:QIONGEXBHIV:requests Labs: poct urine-neg, hgb-13.2 Self breast exam: done occ   reports that she has never smoked. She does not have any smokeless tobacco history on file. She reports that she drinks alcohol. She reports that she does not use illicit drugs.  Past Medical History  Diagnosis Date  . SUI (stress urinary incontinence, female)     History reviewed. No pertinent past surgical history.  Current Outpatient Prescriptions  Medication Sig Dispense Refill  . CALCIUM PO Take by mouth daily. + D    . Multiple Vitamins-Minerals (MULTIVITAMIN PO) Take by mouth daily.      No current facility-administered medications for this visit.    Family History  Problem Relation Age of Onset  . Cancer Sister     appendix cancer   . Hyperlipidemia Mother     ROS:  Pertinent items are noted in HPI.  Otherwise, a comprehensive ROS was negative.  Exam:   BP 110/68 mmHg  Pulse 72  Resp 16  Ht 5' 6.5" (1.689 m)  Wt 181 lb (82.101 kg)  BMI 28.78 kg/m2  LMP 02/11/2011 Height: 5' 6.5" (168.9 cm) Ht Readings from Last 3 Encounters:  01/16/16 5' 6.5" (1.689 m)  01/10/15 5' 6.75" (1.695 m)  03/30/14 5\' 7"  (1.702 m)    General appearance: alert, cooperative and appears stated age Head: Normocephalic, without obvious abnormality, atraumatic Neck: no adenopathy, supple, symmetrical, trachea midline and thyroid normal to inspection and  palpation Lungs: clear to auscultation bilaterally Breasts: normal appearance, no masses or tenderness, No nipple retraction or dimpling, No nipple discharge or bleeding, No axillary or supraclavicular adenopathy Heart: regular rate and rhythm Abdomen: soft, non-tender; no masses,  no organomegaly Extremities: extremities normal, atraumatic, no cyanosis or edema Skin: Skin color, texture, turgor normal. No rashes or lesions Lymph nodes: Cervical, supraclavicular, and axillary nodes normal. No abnormal inguinal nodes palpated Neurologic: Grossly normal   Pelvic: External genitalia:  no lesions              Urethra:  normal appearing urethra with no masses, tenderness or lesions              Bartholin's and Skene's: normal                 Vagina: normal appearing vagina with normal color and discharge, no lesions              Cervix: no cervical motion tenderness, no lesions and normal appearance              Pap taken: Yes.   Bimanual Exam:  Uterus:  normal size, contour, position, consistency, mobility, non-tender              Adnexa: normal adnexa and no mass, fullness, tenderness               Rectovaginal: Confirms  Anus:  normal sphincter tone, no lesions  Chaperone present: yes  A:  Well Woman with normal exam  Menopausal no HRT  Screening labs  P:   Reviewed health and wellness pertinent to exam  Aware of need to evaluate if vaginal bleeding  Labs: Hep C, HIV, Lipid panel  Vitamin D  Pap smear as above with HPVHR   counseled on breast self exam, mammography screening, adequate intake of calcium and vitamin D, diet and exercise, Kegel's exercises  return annually or prn  An After Visit Summary was printed and given to the patient.

## 2016-01-16 NOTE — Progress Notes (Signed)
Encounter reviewed Marques Ericson, MD   

## 2016-01-16 NOTE — Patient Instructions (Signed)

## 2016-01-17 LAB — HEPATITIS C ANTIBODY: HCV AB: NEGATIVE

## 2016-01-17 LAB — VITAMIN D 25 HYDROXY (VIT D DEFICIENCY, FRACTURES): VIT D 25 HYDROXY: 36 ng/mL (ref 30–100)

## 2016-01-17 LAB — HIV ANTIBODY (ROUTINE TESTING W REFLEX): HIV: NONREACTIVE

## 2016-01-20 LAB — IPS PAP TEST WITH HPV

## 2016-01-21 ENCOUNTER — Telehealth: Payer: Self-pay

## 2016-01-21 NOTE — Telephone Encounter (Signed)
Phone service is unavailable.try call again later.

## 2016-01-21 NOTE — Telephone Encounter (Signed)
Patient notified of results as written by provider 

## 2016-01-21 NOTE — Telephone Encounter (Signed)
Patient returned call

## 2016-01-21 NOTE — Telephone Encounter (Signed)
-----   Message from Verner Choleborah S Leonard, CNM sent at 01/17/2016  7:42 AM EDT ----- Notify patient that her HIV and Hep C are negative Vitamin D is normal, would recommend Vit. D 3 daily 800 IU to maintain Lipid panel cholesterol borderline high at 218 previous 212 HDL is great at 66, LDL optimal level

## 2016-01-31 ENCOUNTER — Other Ambulatory Visit: Payer: Self-pay | Admitting: Certified Nurse Midwife

## 2016-01-31 DIAGNOSIS — Z1231 Encounter for screening mammogram for malignant neoplasm of breast: Secondary | ICD-10-CM

## 2016-03-02 ENCOUNTER — Ambulatory Visit: Payer: BC Managed Care – PPO

## 2016-03-06 ENCOUNTER — Ambulatory Visit
Admission: RE | Admit: 2016-03-06 | Discharge: 2016-03-06 | Disposition: A | Discharge status: Home / Self Care | Payer: BC Managed Care – PPO | Source: Ambulatory Visit | Attending: Certified Nurse Midwife | Admitting: Certified Nurse Midwife

## 2016-03-06 DIAGNOSIS — Z1231 Encounter for screening mammogram for malignant neoplasm of breast: Secondary | ICD-10-CM

## 2017-01-21 ENCOUNTER — Ambulatory Visit (INDEPENDENT_AMBULATORY_CARE_PROVIDER_SITE_OTHER): Payer: BC Managed Care – PPO | Admitting: Certified Nurse Midwife

## 2017-01-21 ENCOUNTER — Encounter: Payer: Self-pay | Admitting: Certified Nurse Midwife

## 2017-01-21 VITALS — BP 110/62 | HR 70 | Resp 16 | Ht 66.5 in | Wt 180.0 lb

## 2017-01-21 DIAGNOSIS — Z01419 Encounter for gynecological examination (general) (routine) without abnormal findings: Secondary | ICD-10-CM | POA: Diagnosis not present

## 2017-01-21 DIAGNOSIS — Z0000 Encounter for general adult medical examination without abnormal findings: Secondary | ICD-10-CM | POA: Diagnosis not present

## 2017-01-21 NOTE — Progress Notes (Signed)
61 y.o. [id number] Married  Caucasian Fe here for annual exam. Menopausal no HRT. Denies vaginal dryness or vaginal bleeding. Was seen for flu like symptoms in the winter and [facility]. Had taken flu vaccine. No treatment needed. Had episode of ? 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 Resolved in a few seconds, Any concerns. Patient felt it was stress. Occurred once more during that time, no change and has not occurred since out on vacation. No other health issues today. Screening labs desired.   Patient's last menstrual period was 02/11/2011.          Sexually active: Yes.    The current method of family planning is post menopausal status.    Exercising: Yes.    walking Smoker:  no  Health Maintenance: Pap:  01-03-14 neg, 01-16-16 neg HPV HR neg History of Abnormal Pap: no MMG:  03-06-16 category c density birads 1:neg Self Breast exams: occ Colonoscopy:  2013 neg f/u 1580yrs BMD:   none TDaP:  2009 Shingles: no Pneumonia: no Hep C and HIV: both neg 2017 Labs: yes   reports that she has never smoked. She has never used smokeless tobacco. She reports that she drinks alcohol. She reports that she does not use drugs.  Past Medical History:  Diagnosis Date  . SUI (stress urinary incontinence, female)     No past surgical history on file.  Current Outpatient Prescriptions  Medication Sig Dispense Refill  . CALCIUM PO Take by mouth daily. + D    . Multiple Vitamins-Minerals (MULTIVITAMIN PO) Take by mouth daily.      No current facility-administered medications for this visit.     Family History  Problem Relation Age of Onset  . Cancer Sister        appendix cancer   . Hyperlipidemia Mother     ROS:  Pertinent items are noted in HPI.  Otherwise, a comprehensive ROS was negative.  Exam:   LMP 02/11/2011    Ht Readings from Last 3 Encounters:  01/16/16 5' 6.5" (1.689 m)  01/10/15 5' 6.75" (1.695 m)   03/30/14 5\' 7"  (1.702 m)    General appearance: alert, cooperative and appears stated age Head: Normocephalic, without obvious abnormality, atraumatic Neck: no adenopathy, supple, symmetrical, trachea midline and thyroid normal to inspection and palpation Lungs: clear to auscultation bilaterally Breasts: normal appearance, no masses or tenderness, No nipple retraction or dimpling, No nipple discharge or bleeding, No axillary or supraclavicular adenopathy Heart: regular rate and rhythm Abdomen: soft, non-tender; no masses,  no organomegaly Extremities: extremities normal, atraumatic, no cyanosis or edema Skin: Skin color, texture, turgor normal. No rashes or lesions Lymph nodes: Cervical, supraclavicular, and axillary nodes normal. No abnormal inguinal nodes palpated Neurologic: Grossly normal   Pelvic: External genitalia:  no lesions              Urethra:  normal appearing urethra with no masses, tenderness or lesions              Bartholin's and Skene's: normal                 Vagina: normal appearing vagina with normal color and discharge, no lesions              Cervix: multiparous appearance, no cervical motion tenderness and no lesions              Pap taken: No.  Bimanual Exam:  Uterus:  normal size, contour, position, consistency, mobility, non-tender and anteverted              Adnexa: normal adnexa and no mass, fullness, tenderness               Rectovaginal: Confirms               Anus:  normal sphincter tone, no lesions  Chaperone present: yes  A:  Well Woman with normal exam  Menopausal no HRT  ? Chest pain vs stress vs muscle strain with end of school year, no sequelae  Screening labs  P:   Reviewed health and wellness pertinent to exam  Aware of need to evaluate if vaginal bleeding  Discussed need to evaluate if continues to occur with PCP or cardiology referral. Reviewed warning signs of heart attack, and need to seek care at ER or 911. Patient does not feel this  was the case. Agrees to see PCP if occurs again.  Labs: CMP,Lipid panel,CBC, TSH, Vitamin D  Pap smear: no   counseled on breast self exam, mammography screening, adequate intake of calcium and vitamin D, diet and exercise, kegel exercise  return annually or prn  An After Visit Summary was printed and given to the patient.

## 2017-01-21 NOTE — Patient Instructions (Signed)

## 2017-01-22 LAB — LIPID PANEL
CHOLESTEROL TOTAL: 230 mg/dL — AB (ref 100–199)
Chol/HDL Ratio: 3.3 ratio (ref 0.0–4.4)
HDL: 69 mg/dL (ref >39)
LDL Calculated: 143 mg/dL — ABNORMAL HIGH (ref 0–99)
TRIGLYCERIDES: 88 mg/dL (ref 0–149)
VLDL Cholesterol Cal: 18 mg/dL (ref 5–40)

## 2017-01-22 LAB — COMPREHENSIVE METABOLIC PANEL
ALK PHOS: 81 IU/L (ref 39–117)
ALT: 20 IU/L (ref 0–32)
AST: 18 IU/L (ref 0–40)
Albumin/Globulin Ratio: 2.7 — ABNORMAL HIGH (ref 1.2–2.2)
Albumin: 4.6 g/dL (ref 3.5–5.5)
BUN/Creatinine Ratio: 19 (ref 9–23)
BUN: 14 mg/dL (ref 6–24)
Bilirubin Total: 0.4 mg/dL (ref 0.0–1.2)
CALCIUM: 9.4 mg/dL (ref 8.7–10.2)
CO2: 24 mmol/L (ref 20–29)
CREATININE: 0.74 mg/dL (ref 0.57–1.00)
Chloride: 101 mmol/L (ref 96–106)
GFR calc Af Amer: 103 mL/min/{1.73_m2} (ref >59)
GFR calc non Af Amer: 90 mL/min/{1.73_m2} (ref >59)
GLOBULIN, TOTAL: 1.7 g/dL (ref 1.5–4.5)
Glucose: 91 mg/dL (ref 65–99)
Potassium: 4.7 mmol/L (ref 3.5–5.2)
SODIUM: 145 mmol/L — AB (ref 134–144)
TOTAL PROTEIN: 6.3 g/dL (ref 6.0–8.5)

## 2017-01-22 LAB — CBC
HEMOGLOBIN: 13.2 g/dL (ref 11.1–15.9)
Hematocrit: 39.2 % (ref 34.0–46.6)
MCH: 29.7 pg (ref 26.6–33.0)
MCHC: 33.7 g/dL (ref 31.5–35.7)
MCV: 88 fL (ref 79–97)
PLATELETS: 257 10*3/uL (ref 150–379)
RBC: 4.44 x10E6/uL (ref 3.77–5.28)
RDW: 13.2 % (ref 12.3–15.4)
WBC: 5.8 10*3/uL (ref 3.4–10.8)

## 2017-01-22 LAB — TSH: TSH: 1.2 u[IU]/mL (ref 0.450–4.500)

## 2017-01-22 LAB — VITAMIN D 25 HYDROXY (VIT D DEFICIENCY, FRACTURES): VIT D 25 HYDROXY: 28.3 ng/mL — AB (ref 30.0–100.0)

## 2017-01-25 ENCOUNTER — Other Ambulatory Visit: Payer: Self-pay | Admitting: Certified Nurse Midwife

## 2017-01-27 ENCOUNTER — Other Ambulatory Visit: Payer: Self-pay | Admitting: *Deleted

## 2017-01-27 DIAGNOSIS — E559 Vitamin D deficiency, unspecified: Secondary | ICD-10-CM

## 2017-01-27 DIAGNOSIS — E789 Disorder of lipoprotein metabolism, unspecified: Secondary | ICD-10-CM

## 2017-01-27 DIAGNOSIS — E870 Hyperosmolality and hypernatremia: Secondary | ICD-10-CM

## 2017-01-27 DIAGNOSIS — R7989 Other specified abnormal findings of blood chemistry: Secondary | ICD-10-CM

## 2017-02-01 ENCOUNTER — Other Ambulatory Visit: Payer: Self-pay | Admitting: Certified Nurse Midwife

## 2017-02-01 DIAGNOSIS — Z1231 Encounter for screening mammogram for malignant neoplasm of breast: Secondary | ICD-10-CM

## 2017-02-03 ENCOUNTER — Other Ambulatory Visit (INDEPENDENT_AMBULATORY_CARE_PROVIDER_SITE_OTHER): Payer: BC Managed Care – PPO

## 2017-02-03 DIAGNOSIS — E870 Hyperosmolality and hypernatremia: Secondary | ICD-10-CM

## 2017-02-03 DIAGNOSIS — R7989 Other specified abnormal findings of blood chemistry: Secondary | ICD-10-CM

## 2017-02-04 LAB — COMPREHENSIVE METABOLIC PANEL
ALBUMIN: 4.3 g/dL (ref 3.5–5.5)
ALK PHOS: 73 IU/L (ref 39–117)
ALT: 16 IU/L (ref 0–32)
AST: 17 IU/L (ref 0–40)
Albumin/Globulin Ratio: 2 (ref 1.2–2.2)
BUN / CREAT RATIO: 18 (ref 9–23)
BUN: 14 mg/dL (ref 6–24)
Bilirubin Total: 0.5 mg/dL (ref 0.0–1.2)
CO2: 26 mmol/L (ref 20–29)
CREATININE: 0.79 mg/dL (ref 0.57–1.00)
Calcium: 9.3 mg/dL (ref 8.7–10.2)
Chloride: 104 mmol/L (ref 96–106)
GFR calc non Af Amer: 83 mL/min/{1.73_m2} (ref >59)
GFR, EST AFRICAN AMERICAN: 95 mL/min/{1.73_m2} (ref >59)
GLUCOSE: 94 mg/dL (ref 65–99)
Globulin, Total: 2.1 g/dL (ref 1.5–4.5)
Potassium: 4.9 mmol/L (ref 3.5–5.2)
Sodium: 142 mmol/L (ref 134–144)
TOTAL PROTEIN: 6.4 g/dL (ref 6.0–8.5)

## 2017-03-16 ENCOUNTER — Ambulatory Visit
Admission: RE | Admit: 2017-03-16 | Discharge: 2017-03-16 | Disposition: A | Discharge status: Home / Self Care | Payer: BC Managed Care – PPO | Source: Ambulatory Visit | Attending: Certified Nurse Midwife | Admitting: Certified Nurse Midwife

## 2017-03-16 DIAGNOSIS — Z1231 Encounter for screening mammogram for malignant neoplasm of breast: Secondary | ICD-10-CM

## 2017-04-28 ENCOUNTER — Other Ambulatory Visit (INDEPENDENT_AMBULATORY_CARE_PROVIDER_SITE_OTHER): Payer: BC Managed Care – PPO

## 2017-04-28 DIAGNOSIS — E559 Vitamin D deficiency, unspecified: Secondary | ICD-10-CM

## 2017-04-29 LAB — VITAMIN D 25 HYDROXY (VIT D DEFICIENCY, FRACTURES): Vit D, 25-Hydroxy: 36.8 ng/mL (ref 30.0–100.0)

## 2017-07-20 ENCOUNTER — Other Ambulatory Visit: Payer: Self-pay

## 2017-07-23 ENCOUNTER — Other Ambulatory Visit (INDEPENDENT_AMBULATORY_CARE_PROVIDER_SITE_OTHER): Payer: BC Managed Care – PPO

## 2017-07-23 DIAGNOSIS — E789 Disorder of lipoprotein metabolism, unspecified: Secondary | ICD-10-CM

## 2017-07-24 LAB — LIPID PANEL
CHOL/HDL RATIO: 3 ratio (ref 0.0–4.4)
Cholesterol, Total: 216 mg/dL — ABNORMAL HIGH (ref 100–199)
HDL: 71 mg/dL (ref >39)
LDL Calculated: 134 mg/dL — ABNORMAL HIGH (ref 0–99)
Triglycerides: 53 mg/dL (ref 0–149)
VLDL CHOLESTEROL CAL: 11 mg/dL (ref 5–40)

## 2018-01-27 NOTE — Progress Notes (Signed)
62 y.o. [id number] Married  Caucasian Fe here for annual exam. Menopausal no vaginal bleeding or vaginal dryness. Taking Vitamin D supplement daily. Sees Urgent care if needed. Has been good year, no issues. Continues to exercise and stay active. No health issues today. Screening labs requested if needed.   Patient's last menstrual period was 02/11/2011.          Sexually active: Yes.    The current method of family planning is post menopausal status.    Exercising: Yes.    walk, yoga, weights Smoker:  no  Health Maintenance: Pap:  01-16-16 neg HPV HR neg History of Abnormal Pap: no MMG:  03-16-17 category c density birads 1:neg Self Breast exams: no Colonoscopy:  2013 neg f/u 532yrs BMD:   none TDaP:  2009 requests Shingles: no Pneumonia: no Hep C and HIV: both neg 2017 Labs: yes   reports that she has never smoked. She has never used smokeless tobacco. She reports that she drinks about 1.8 oz of alcohol per week. She reports that she does not use drugs.  Past Medical History:  Diagnosis Date  . SUI (stress urinary incontinence, female)     History reviewed. No pertinent surgical history.  Current Outpatient Medications  Medication Sig Dispense Refill  . Cholecalciferol (VITAMIN D PO) Take by mouth.    . Multiple Vitamins-Minerals (MULTIVITAMIN PO) Take by mouth as needed.      No current facility-administered medications for this visit.     Family History  Problem Relation Age of Onset  . Hyperlipidemia Mother   . Cancer Sister        appendix cancer     ROS:  Pertinent items are noted in HPI.  Otherwise, a comprehensive ROS was negative.  Exam:   BP 120/82   Pulse 70   Resp 16   Ht 5' 6.75" (1.695 m)   Wt 177 lb (80.3 kg)   LMP 02/11/2011   BMI 27.93 kg/m  Height: 5' 6.75" (169.5 cm) Ht Readings from Last 3 Encounters:  01/28/18 5' 6.75" (1.695 m)  01/21/17 5' 6.5" (1.689 m)  01/16/16 5' 6.5" (1.689 m)    General appearance: alert, cooperative and appears  stated age Head: Normocephalic, without obvious abnormality, atraumatic Neck: no adenopathy, supple, symmetrical, trachea midline and thyroid normal to inspection and palpation Lungs: clear to auscultation bilaterally Breasts: normal appearance, no masses or tenderness, No nipple retraction or dimpling, No nipple discharge or bleeding, No axillary or supraclavicular adenopathy Heart: regular rate and rhythm Abdomen: soft, non-tender; no masses,  no organomegaly Extremities: extremities normal, atraumatic, no cyanosis or edema Skin: Skin color, texture, turgor normal. No rashes or lesions Lymph nodes: Cervical, supraclavicular, and axillary nodes normal. No abnormal inguinal nodes palpated Neurologic: Grossly normal   Pelvic: External genitalia:  no lesions, normal female              Urethra:  normal appearing urethra with no masses, tenderness or lesions              Bartholin's and Skene's: normal                 Vagina: normal appearing vagina with normal color and discharge, no lesions, slight dryness noted at introitus only, non tender              Cervix: no cervical motion tenderness, no lesions and normal appearance              Pap taken: Yes.  Bimanual Exam:  Uterus:  normal size, contour, position, consistency, mobility, non-tender and anteverted              Adnexa: normal adnexa and no mass, fullness, tenderness               Rectovaginal: Confirms               Anus:  normal sphincter tone, no lesions  Chaperone present: yes  A:  Well Woman with normal exam  Menopausal no HRT  Vaginal dryness  BMD due this year  Immunization update  Screening labs  P:   Reviewed health and wellness pertinent to exam  Aware of need to advise if vaginal bleeding  Discussed coconut oil use twice weekly with instructions. Advise if issues.  Discussed BMD and doing base line screening after she turns 62 this year with mammogram. Patient will call to schedule. Order placed for  BMD.  Requests TDAP  Labs: Lipid panel, TSH,Vit. D, CBC, CMP  IFOB dispensed with instructions  Pap smear: yes   counseled on breast self exam, mammography screening, feminine hygiene, osteoporosis, adequate intake of calcium and vitamin D, diet and exercise  return annually or prn  An After Visit Summary was printed and given to the patient.

## 2018-01-28 ENCOUNTER — Other Ambulatory Visit: Payer: Self-pay

## 2018-01-28 ENCOUNTER — Other Ambulatory Visit (HOSPITAL_COMMUNITY)
Admission: RE | Admit: 2018-01-28 | Discharge: 2018-01-28 | Disposition: A | Discharge status: Home / Self Care | Payer: BC Managed Care – PPO | Source: Ambulatory Visit | Attending: Obstetrics & Gynecology | Admitting: Obstetrics & Gynecology

## 2018-01-28 ENCOUNTER — Ambulatory Visit: Payer: BC Managed Care – PPO | Admitting: Certified Nurse Midwife

## 2018-01-28 ENCOUNTER — Encounter: Payer: Self-pay | Admitting: Certified Nurse Midwife

## 2018-01-28 VITALS — BP 120/82 | HR 70 | Resp 16 | Ht 66.75 in | Wt 177.0 lb

## 2018-01-28 DIAGNOSIS — Z0000 Encounter for general adult medical examination without abnormal findings: Secondary | ICD-10-CM

## 2018-01-28 DIAGNOSIS — Z124 Encounter for screening for malignant neoplasm of cervix: Secondary | ICD-10-CM | POA: Insufficient documentation

## 2018-01-28 DIAGNOSIS — Z1211 Encounter for screening for malignant neoplasm of colon: Secondary | ICD-10-CM | POA: Diagnosis not present

## 2018-01-28 DIAGNOSIS — E559 Vitamin D deficiency, unspecified: Secondary | ICD-10-CM

## 2018-01-28 DIAGNOSIS — Z01419 Encounter for gynecological examination (general) (routine) without abnormal findings: Secondary | ICD-10-CM | POA: Diagnosis not present

## 2018-01-28 DIAGNOSIS — N951 Menopausal and female climacteric states: Secondary | ICD-10-CM

## 2018-01-28 DIAGNOSIS — Z780 Asymptomatic menopausal state: Secondary | ICD-10-CM | POA: Diagnosis not present

## 2018-01-28 DIAGNOSIS — Z23 Encounter for immunization: Secondary | ICD-10-CM | POA: Diagnosis not present

## 2018-01-28 NOTE — Addendum Note (Signed)
Addended by: Verner CholLEONARD, DEBORAH S on: 01/28/2018 04:04 PM   Modules accepted: Orders

## 2018-01-29 LAB — CBC
Hematocrit: 41.9 % (ref 34.0–46.6)
Hemoglobin: 14 g/dL (ref 11.1–15.9)
MCH: 29.9 pg (ref 26.6–33.0)
MCHC: 33.4 g/dL (ref 31.5–35.7)
MCV: 89 fL (ref 79–97)
Platelets: 289 10*3/uL (ref 150–450)
RBC: 4.69 x10E6/uL (ref 3.77–5.28)
RDW: 13.3 % (ref 12.3–15.4)
WBC: 8.8 10*3/uL (ref 3.4–10.8)

## 2018-01-29 LAB — COMPREHENSIVE METABOLIC PANEL
ALT: 28 IU/L (ref 0–32)
AST: 21 IU/L (ref 0–40)
Albumin/Globulin Ratio: 2.3 — ABNORMAL HIGH (ref 1.2–2.2)
Albumin: 4.8 g/dL (ref 3.5–5.5)
Alkaline Phosphatase: 78 IU/L (ref 39–117)
BUN / CREAT RATIO: 25 — AB (ref 9–23)
BUN: 17 mg/dL (ref 6–24)
Bilirubin Total: 0.6 mg/dL (ref 0.0–1.2)
CHLORIDE: 103 mmol/L (ref 96–106)
CO2: 26 mmol/L (ref 20–29)
Calcium: 9.6 mg/dL (ref 8.7–10.2)
Creatinine, Ser: 0.69 mg/dL (ref 0.57–1.00)
GFR calc Af Amer: 110 mL/min/{1.73_m2} (ref >59)
GFR calc non Af Amer: 96 mL/min/{1.73_m2} (ref >59)
GLUCOSE: 93 mg/dL (ref 65–99)
Globulin, Total: 2.1 g/dL (ref 1.5–4.5)
POTASSIUM: 4.9 mmol/L (ref 3.5–5.2)
Sodium: 143 mmol/L (ref 134–144)
Total Protein: 6.9 g/dL (ref 6.0–8.5)

## 2018-01-29 LAB — LIPID PANEL
CHOLESTEROL TOTAL: 238 mg/dL — AB (ref 100–199)
Chol/HDL Ratio: 3.7 ratio (ref 0.0–4.4)
HDL: 65 mg/dL (ref >39)
LDL Calculated: 152 mg/dL — ABNORMAL HIGH (ref 0–99)
Triglycerides: 104 mg/dL (ref 0–149)
VLDL CHOLESTEROL CAL: 21 mg/dL (ref 5–40)

## 2018-01-29 LAB — TSH: TSH: 1.37 u[IU]/mL (ref 0.450–4.500)

## 2018-01-29 LAB — VITAMIN D 25 HYDROXY (VIT D DEFICIENCY, FRACTURES): Vit D, 25-Hydroxy: 29.8 ng/mL — ABNORMAL LOW (ref 30.0–100.0)

## 2018-01-31 LAB — CYTOLOGY - PAP: Diagnosis: NEGATIVE

## 2018-02-03 ENCOUNTER — Telehealth: Payer: Self-pay | Admitting: *Deleted

## 2018-02-03 DIAGNOSIS — R899 Unspecified abnormal finding in specimens from other organs, systems and tissues: Secondary | ICD-10-CM

## 2018-02-03 NOTE — Telephone Encounter (Signed)
Notes recorded by Leda MinHamm, Lorik Guo N, RN on 02/03/2018 at 11:24 AM EDT Left message to call Noreene LarssonJill at [phone].   Notes recorded by Verner CholLeonard, Deborah S, CNM on 01/31/2018 at 2:22 PM EDT Notify patient her Liver, kidney, glucose was essentially normal, BUN/Creatinine ratio slightly elevated but BUN and Creatinine levels normal Albumin/Globulin ratio borderline elevated, but Albumin and Globulin normal Lipid panel shows cholesterol elevation of 238 <199 is normal LDL is elevated at 152 normal<99 Triglycerides are normal at 104, HDL is normal at 65 Need to work on diet low in cholesterol, high in vegetables and low sugar fruits, decrease beef and fatty foods, increase in lean meat, chicken and fish. Start on Omega 3 fish oil 1000 IU daily Work on regular exercise, Recheck in 3 months if no significant change will need referral to PCP. Patient needs to fasting for recheck. TSh is normal Vitamin  D is low at 29.8, would like range closer to 40-50 in menopause. Should be on 1000 IU vit. D 3 daily CBC is normal, no anemia Pap smear is negative 02

## 2018-02-04 NOTE — Telephone Encounter (Signed)
Spoke with patient, advised as seen below per Leota Sauerseborah Leonard, CNM. Fasting lab appt scheduled for 05/12/18 at 8:30am.  Patient verbalizes understanding and is agreeable. Next AEX 02/15/19  Future lab orders placed for Lipid panel and CMP.   Encounter closed.

## 2018-02-16 ENCOUNTER — Other Ambulatory Visit: Payer: Self-pay | Admitting: Certified Nurse Midwife

## 2018-02-16 DIAGNOSIS — E2839 Other primary ovarian failure: Secondary | ICD-10-CM

## 2018-02-18 ENCOUNTER — Other Ambulatory Visit: Payer: Self-pay | Admitting: Certified Nurse Midwife

## 2018-02-18 DIAGNOSIS — Z1231 Encounter for screening mammogram for malignant neoplasm of breast: Secondary | ICD-10-CM

## 2018-03-03 ENCOUNTER — Other Ambulatory Visit: Payer: Self-pay | Admitting: Certified Nurse Midwife

## 2018-04-14 ENCOUNTER — Ambulatory Visit
Admission: RE | Admit: 2018-04-14 | Discharge: 2018-04-14 | Disposition: A | Discharge status: Home / Self Care | Payer: BC Managed Care – PPO | Source: Ambulatory Visit | Attending: Certified Nurse Midwife | Admitting: Certified Nurse Midwife

## 2018-04-14 DIAGNOSIS — Z1231 Encounter for screening mammogram for malignant neoplasm of breast: Secondary | ICD-10-CM

## 2018-04-14 DIAGNOSIS — E2839 Other primary ovarian failure: Secondary | ICD-10-CM

## 2018-05-12 ENCOUNTER — Other Ambulatory Visit: Payer: Self-pay

## 2018-05-23 ENCOUNTER — Other Ambulatory Visit (INDEPENDENT_AMBULATORY_CARE_PROVIDER_SITE_OTHER): Payer: BC Managed Care – PPO

## 2018-05-23 DIAGNOSIS — R899 Unspecified abnormal finding in specimens from other organs, systems and tissues: Secondary | ICD-10-CM

## 2018-05-24 LAB — COMPREHENSIVE METABOLIC PANEL
ALBUMIN: 4.5 g/dL (ref 3.6–4.8)
ALT: 18 IU/L (ref 0–32)
AST: 17 IU/L (ref 0–40)
Albumin/Globulin Ratio: 2.6 — ABNORMAL HIGH (ref 1.2–2.2)
Alkaline Phosphatase: 75 IU/L (ref 39–117)
BILIRUBIN TOTAL: 0.6 mg/dL (ref 0.0–1.2)
BUN / CREAT RATIO: 23 (ref 12–28)
BUN: 18 mg/dL (ref 8–27)
CHLORIDE: 104 mmol/L (ref 96–106)
CO2: 25 mmol/L (ref 20–29)
Calcium: 9.4 mg/dL (ref 8.7–10.3)
Creatinine, Ser: 0.79 mg/dL (ref 0.57–1.00)
GFR calc non Af Amer: 82 mL/min/{1.73_m2} (ref >59)
GFR, EST AFRICAN AMERICAN: 94 mL/min/{1.73_m2} (ref >59)
GLUCOSE: 101 mg/dL — AB (ref 65–99)
Globulin, Total: 1.7 g/dL (ref 1.5–4.5)
POTASSIUM: 4.5 mmol/L (ref 3.5–5.2)
Sodium: 143 mmol/L (ref 134–144)
Total Protein: 6.2 g/dL (ref 6.0–8.5)

## 2018-05-24 LAB — LIPID PANEL
CHOL/HDL RATIO: 3.1 ratio (ref 0.0–4.4)
Cholesterol, Total: 198 mg/dL (ref 100–199)
HDL: 63 mg/dL (ref >39)
LDL Calculated: 120 mg/dL — ABNORMAL HIGH (ref 0–99)
Triglycerides: 77 mg/dL (ref 0–149)
VLDL CHOLESTEROL CAL: 15 mg/dL (ref 5–40)

## 2018-05-25 ENCOUNTER — Telehealth: Payer: Self-pay | Admitting: *Deleted

## 2018-05-25 NOTE — Telephone Encounter (Signed)
Notes recorded by Leda MinHamm, Lyn Deemer N, RN on 05/25/2018 at 12:24 PM EST Spoke with patient. Patient will return call to discuss results, now is not a good time. ------  Notes recorded by Verner CholLeonard, Deborah S, CNM on 05/25/2018 at 8:02 AM EST Notify patient her liver kidney and glucose profile showed borderline elevated glucose at 101, work on decrease carbohydrates and sugars in diet recheck at aex. Her Albumin/globulin ratio is still increased at 2.6 and feel she should follow up with PCP Lipid profile has improved! Cholesterol from 238 to 198 and LDL from 152 to 120 continue to work on diet and exercise. Recheck at aex

## 2018-05-27 NOTE — Telephone Encounter (Signed)
Patient returning Jill's call to discuss results.

## 2018-05-27 NOTE — Telephone Encounter (Signed)
Spoke with patient, advised as seen below per Brittany Cohen, CNM. Patient states she does not have a PCP, declines assistance with scheduling. Patient states she would like to research her PCP options, will return call to advise where to send labs to. Patient verbalizes understanding and is agreeable.  Routing to provider for final review. Patient is agreeable to disposition. Will close encounter. .Marland Kitchen

## 2018-05-27 NOTE — Telephone Encounter (Signed)
Agreeable to plan.

## 2019-02-15 ENCOUNTER — Ambulatory Visit: Payer: BC Managed Care – PPO | Admitting: Certified Nurse Midwife

## 2019-02-16 ENCOUNTER — Other Ambulatory Visit: Payer: Self-pay

## 2019-02-20 ENCOUNTER — Encounter: Payer: Self-pay | Admitting: Certified Nurse Midwife

## 2019-02-20 ENCOUNTER — Ambulatory Visit: Payer: BC Managed Care – PPO | Admitting: Certified Nurse Midwife

## 2019-02-20 ENCOUNTER — Other Ambulatory Visit: Payer: Self-pay

## 2019-02-20 VITALS — BP 124/80 | HR 68 | Temp 97.0°F | Resp 16 | Ht 66.75 in | Wt 180.0 lb

## 2019-02-20 DIAGNOSIS — E559 Vitamin D deficiency, unspecified: Secondary | ICD-10-CM

## 2019-02-20 DIAGNOSIS — R635 Abnormal weight gain: Secondary | ICD-10-CM

## 2019-02-20 DIAGNOSIS — Z01419 Encounter for gynecological examination (general) (routine) without abnormal findings: Secondary | ICD-10-CM | POA: Diagnosis not present

## 2019-02-20 DIAGNOSIS — Z0000 Encounter for general adult medical examination without abnormal findings: Secondary | ICD-10-CM | POA: Diagnosis not present

## 2019-02-20 NOTE — Progress Notes (Signed)
63 y.o. [id number] Married  Caucasian Fe here for annual exam. Menopausal no HRT. Denies vaginal bleeding or vaginal dryness. No hot flashes or night sweats. 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 Fasted for labs. Planning to establish with PCP this year. No health issues today.  Patient's last menstrual period was 02/11/2011.          Sexually active: Yes.    The current method of family planning is post menopausal status.    Exercising: Yes.    walking Smoker:  no  Review of Systems  Constitutional: Negative.   HENT: Negative.   Eyes: Negative.   Respiratory: Negative.   Cardiovascular: Negative.   Gastrointestinal: Negative.   Genitourinary: Negative.   Musculoskeletal: Negative.   Skin: Negative.   Neurological: Negative.   Endo/Heme/Allergies: Negative.   Psychiatric/Behavioral: Negative.     Health Maintenance: Pap:  01-16-16 neg HPV HR neg, 01-28-18 neg History of Abnormal Pap: no MMG:  04-14-18 category c density birads 1:neg Self Breast exams: occ Colonoscopy:  2013 neg f/u 5257yrs BMD:  2019 TDaP:  2019 Shingles: not done Pneumonia: not done Hep C and HIV: both neg 2017 Labs: yes   reports that she has never smoked. She has never used smokeless tobacco. She reports current alcohol use of about 3.0 standard drinks of alcohol per week. She reports that she does not use drugs.  Past Medical History:  Diagnosis Date  . SUI (stress urinary incontinence, female)     No past surgical history on file.  Current Outpatient Medications  Medication Sig Dispense Refill  . Cholecalciferol (VITAMIN D PO) Take by mouth.    . Multiple Vitamins-Minerals (MULTIVITAMIN PO) Take by mouth as needed.      No current facility-administered medications for this visit.     Family History  Problem Relation Age of Onset  . Hyperlipidemia Mother   . Cancer Sister        appendix cancer     ROS:  Pertinent items are noted in HPI.  Otherwise, a comprehensive ROS was  negative.  Exam:   LMP 02/11/2011    Ht Readings from Last 3 Encounters:  01/28/18 5' 6.75" (1.695 m)  01/21/17 5' 6.5" (1.689 m)  01/16/16 5' 6.5" (1.689 m)    General appearance: alert, cooperative and appears stated age Head: Normocephalic, without obvious abnormality, atraumatic Neck: no adenopathy, supple, symmetrical, trachea midline and thyroid normal to inspection and palpation Lungs: clear to auscultation bilaterally Breasts: normal appearance, no masses or tenderness, No nipple retraction or dimpling, No nipple discharge or bleeding, No axillary or supraclavicular adenopathy Heart: regular rate and rhythm Abdomen: soft, non-tender; no masses,  no organomegaly Extremities: extremities normal, atraumatic, no cyanosis or edema Skin: Skin color, texture, turgor normal. No rashes or lesions Lymph nodes: Cervical, supraclavicular, and axillary nodes normal. No abnormal inguinal nodes palpated Neurologic: Grossly normal   Pelvic: External genitalia:  no lesions              Urethra:  normal appearing urethra with no masses, tenderness or lesions              Bartholin's and Skene's: normal                 Vagina: normal appearing vagina with normal color and discharge, no lesions              Cervix: multiparous appearance, no cervical motion tenderness and no lesions  Pap taken: No. Bimanual Exam:  Uterus:  normal size, contour, position, consistency, mobility, non-tender and anteverted              Adnexa: normal adnexa and no mass, fullness, tenderness               Rectovaginal: Confirms               Anus:  normal sphincter tone, no lesions  Chaperone present: yes  A:  Well Woman with normal exam  Menopausal no HRT  Vaginal dryness noted with exam  Weight Gain  Screening labs  P:   Reviewed health and wellness pertinent to exam  Aware of need to advise if vaginal bleeding.  Discussed finding and treatment options of estrogen cream, OTC coconut oil or  Olive oil or Replens. Instructions given for use of Coconut oil and to advise if no change. Questions addressed.  Labs: Lipid panel, CMP, TSH, CBC, Vitamin D  Pap smear: no    counseled on breast self exam, mammography screening, feminine hygiene, adequate intake of calcium and vitamin D, diet and exercise  return annually or prn  An After Visit Summary was printed and given to the patient.

## 2019-02-21 LAB — COMPREHENSIVE METABOLIC PANEL
ALT: 20 IU/L (ref 0–32)
AST: 18 IU/L (ref 0–40)
Albumin/Globulin Ratio: 2.6 — ABNORMAL HIGH (ref 1.2–2.2)
Albumin: 4.6 g/dL (ref 3.8–4.9)
Alkaline Phosphatase: 75 IU/L (ref 39–117)
BUN/Creatinine Ratio: 21 (ref 12–28)
BUN: 13 mg/dL (ref 8–27)
Bilirubin Total: 0.5 mg/dL (ref 0.0–1.2)
CO2: 25 mmol/L (ref 20–29)
Calcium: 9.6 mg/dL (ref 8.7–10.3)
Chloride: 103 mmol/L (ref 96–106)
Creatinine, Ser: 0.63 mg/dL (ref 0.57–1.00)
GFR calc Af Amer: 113 mL/min/{1.73_m2} (ref >59)
GFR calc non Af Amer: 98 mL/min/{1.73_m2} (ref >59)
Globulin, Total: 1.8 g/dL (ref 1.5–4.5)
Glucose: 97 mg/dL (ref 65–99)
Potassium: 5.2 mmol/L (ref 3.5–5.2)
Sodium: 142 mmol/L (ref 134–144)
Total Protein: 6.4 g/dL (ref 6.0–8.5)

## 2019-02-21 LAB — LIPID PANEL
Chol/HDL Ratio: 3.4 ratio (ref 0.0–4.4)
Cholesterol, Total: 233 mg/dL — ABNORMAL HIGH (ref 100–199)
HDL: 69 mg/dL (ref >39)
LDL Calculated: 145 mg/dL — ABNORMAL HIGH (ref 0–99)
Triglycerides: 97 mg/dL (ref 0–149)
VLDL Cholesterol Cal: 19 mg/dL (ref 5–40)

## 2019-02-21 LAB — CBC
Hematocrit: 42.6 % (ref 34.0–46.6)
Hemoglobin: 14.1 g/dL (ref 11.1–15.9)
MCH: 31 pg (ref 26.6–33.0)
MCHC: 33.1 g/dL (ref 31.5–35.7)
MCV: 94 fL (ref 79–97)
Platelets: 264 10*3/uL (ref 150–450)
RBC: 4.55 x10E6/uL (ref 3.77–5.28)
RDW: 12.6 % (ref 11.7–15.4)
WBC: 7.7 10*3/uL (ref 3.4–10.8)

## 2019-02-21 LAB — TSH: TSH: 1.21 u[IU]/mL (ref 0.450–4.500)

## 2019-02-21 LAB — VITAMIN D 25 HYDROXY (VIT D DEFICIENCY, FRACTURES): Vit D, 25-Hydroxy: 31.3 ng/mL (ref 30.0–100.0)

## 2019-02-22 ENCOUNTER — Other Ambulatory Visit: Payer: Self-pay | Admitting: Certified Nurse Midwife

## 2019-02-22 DIAGNOSIS — R899 Unspecified abnormal finding in specimens from other organs, systems and tissues: Secondary | ICD-10-CM

## 2019-04-07 ENCOUNTER — Other Ambulatory Visit: Payer: Self-pay | Admitting: Certified Nurse Midwife

## 2019-04-07 DIAGNOSIS — Z1231 Encounter for screening mammogram for malignant neoplasm of breast: Secondary | ICD-10-CM

## 2019-05-23 ENCOUNTER — Other Ambulatory Visit: Payer: Self-pay

## 2019-05-23 ENCOUNTER — Ambulatory Visit
Admission: RE | Admit: 2019-05-23 | Discharge: 2019-05-23 | Disposition: A | Discharge status: Home / Self Care | Payer: BC Managed Care – PPO | Source: Ambulatory Visit | Attending: Certified Nurse Midwife | Admitting: Certified Nurse Midwife

## 2019-05-23 DIAGNOSIS — Z1231 Encounter for screening mammogram for malignant neoplasm of breast: Secondary | ICD-10-CM

## 2019-05-24 ENCOUNTER — Other Ambulatory Visit: Payer: Self-pay

## 2019-05-24 ENCOUNTER — Other Ambulatory Visit (INDEPENDENT_AMBULATORY_CARE_PROVIDER_SITE_OTHER): Payer: BC Managed Care – PPO

## 2019-05-24 DIAGNOSIS — R899 Unspecified abnormal finding in specimens from other organs, systems and tissues: Secondary | ICD-10-CM

## 2019-05-25 ENCOUNTER — Other Ambulatory Visit: Payer: Self-pay | Admitting: Certified Nurse Midwife

## 2019-05-25 ENCOUNTER — Other Ambulatory Visit: Payer: BC Managed Care – PPO

## 2019-05-25 DIAGNOSIS — R899 Unspecified abnormal finding in specimens from other organs, systems and tissues: Secondary | ICD-10-CM

## 2019-05-25 LAB — LIPID PANEL
Chol/HDL Ratio: 3.2 ratio (ref 0.0–4.4)
Cholesterol, Total: 226 mg/dL — ABNORMAL HIGH (ref 100–199)
HDL: 70 mg/dL (ref >39)
LDL Chol Calc (NIH): 143 mg/dL — ABNORMAL HIGH (ref 0–99)
Triglycerides: 73 mg/dL (ref 0–149)
VLDL Cholesterol Cal: 13 mg/dL (ref 5–40)

## 2019-05-29 ENCOUNTER — Encounter: Payer: Self-pay | Admitting: Certified Nurse Midwife

## 2019-09-09 ENCOUNTER — Ambulatory Visit: Payer: BC Managed Care – PPO | Attending: Internal Medicine

## 2019-09-09 DIAGNOSIS — Z23 Encounter for immunization: Secondary | ICD-10-CM | POA: Insufficient documentation

## 2019-09-09 NOTE — Progress Notes (Signed)
   Covid-19 Vaccination Clinic  Name:  Reya Aurich    MRN: [id number] DOB: 20-Dec-1955  09/09/2019  Ms. Monter was observed post Covid-19 immunization for 15 minutes without incidence. She was provided with Vaccine Information Sheet and instruction to access the V-Safe system.   Ms. Zabriskie was instructed to call 911 with any severe reactions post vaccine: Marland Kitchen Difficulty breathing  . Swelling of your face and throat  . A fast heartbeat  . A bad rash all over your body  . Dizziness and weakness    Immunizations Administered    Name Date Dose VIS Date Route   Pfizer COVID-19 Vaccine 09/09/2019  4:09 PM 0.3 mL 06/23/2019 Intramuscular   Manufacturer: [employer], [employer]   Lot: [id number]   NDC: [address]-1

## 2019-09-30 ENCOUNTER — Ambulatory Visit: Payer: BC Managed Care – PPO | Attending: Internal Medicine

## 2019-09-30 DIAGNOSIS — Z23 Encounter for immunization: Secondary | ICD-10-CM

## 2019-09-30 NOTE — Progress Notes (Signed)
   Covid-19 Vaccination Clinic  Name:  Brittany Cohen    MRN: [id number] DOB: March 18, 1956  09/30/2019  [name] was observed post Covid-19 immunization for 15 minutes without incident. She was provided with Vaccine Information Sheet and instruction to access the V-Safe system.   [name] was instructed to call 911 with any severe reactions post vaccine: Marland Kitchen Difficulty breathing  . Swelling of face and throat  . A fast heartbeat  . A bad rash all over body  . Dizziness and weakness   Immunizations Administered    Name Date Dose VIS Date Route   Pfizer COVID-19 Vaccine 09/30/2019  8:48 AM 0.3 mL 06/23/2019 Intramuscular   Manufacturer: [employer], [employer]   Lot: [id number]   NDC: [address]-4

## 2019-10-03 ENCOUNTER — Encounter: Payer: Self-pay | Admitting: Certified Nurse Midwife

## 2019-11-21 ENCOUNTER — Other Ambulatory Visit: Payer: BC Managed Care – PPO

## 2020-02-21 ENCOUNTER — Ambulatory Visit: Payer: BC Managed Care – PPO | Admitting: Certified Nurse Midwife

## 2020-02-28 NOTE — Progress Notes (Signed)
64 y.o. [id number] Married White or Caucasian Not Hispanic or Latino female here for annual exam.  Patient would like labs today. No vaginal bleeding. Sexually active, no pain.  H/O mild and tolerable GSI. Only wears a pad with exercise.     Patient's last menstrual period was 02/11/2011.          Sexually active: Yes.    The current method of family planning is post menopausal status.    Exercising: Yes.    Walking Smoker:  no  Health Maintenance: Pap:  01/28/18 Neg no hpv done, 01/16/16 WNL Hr Hpv Neg  History of abnormal Pap:  no MMG:  05/24/19 density C Bi-rads 1 neg  BMD:   04/14/2018 Normal  Colonoscopy: 05/29/19, polyps benign, f/u in 5 years (Sister with colon cancer). 2013 neg f/u 10 years  TDaP:  2019 Gardasil: NA   reports that she has never smoked. She has never used smokeless tobacco. She reports current alcohol use of about 3.0 standard drinks of alcohol per week. She reports that she does not use drugs. 3 grown daughters, oldest daughter has 3 boys. Middle daughter is engaged, getting married in 9/22. Youngest daughter is living at home and going to graduate school for special education.  Teacher's assistant in kindergarten   Past Medical History:  Diagnosis Date  . SUI (stress urinary incontinence, female)     History reviewed. No pertinent surgical history.  Current Outpatient Medications  Medication Sig Dispense Refill  . Cholecalciferol (VITAMIN D PO) Take by mouth.    . Multiple Vitamins-Minerals (MULTIVITAMIN PO) Take by mouth as needed.     . Omega-3 Fatty Acids (FISH OIL PO) Take by mouth.     No current facility-administered medications for this visit.    Family History  Problem Relation Age of Onset  . Hyperlipidemia Mother   . Cancer Sister        appendix cancer     Review of Systems  All other systems reviewed and are negative.   Exam:   BP 124/68   Pulse 66   Ht 5' 6.5" (1.689 m)   Wt 184 lb 9.6 oz (83.7 kg)   LMP 02/11/2011   SpO2 97%    BMI 29.35 kg/m   Weight change: @WEIGHTCHANGE @ Height:   Height: 5' 6.5" (168.9 cm)  Ht Readings from Last 3 Encounters:  02/29/20 5' 6.5" (1.689 m)  02/20/19 5' 6.75" (1.695 m)  01/28/18 5' 6.75" (1.695 m)    General appearance: alert, cooperative and appears stated age Head: Normocephalic, without obvious abnormality, atraumatic Neck: no adenopathy, supple, symmetrical, trachea midline and thyroid normal to inspection and palpation Lungs: clear to auscultation bilaterally Cardiovascular: regular rate and rhythm Breasts: normal appearance, no masses or tenderness. Left nipple is inverted (per patient always inverted) Abdomen: soft, non-tender; non distended,  no masses,  no organomegaly Extremities: extremities normal, atraumatic, no cyanosis or edema Skin: Skin color, texture, turgor normal. No rashes or lesions Lymph nodes: Cervical, supraclavicular, and axillary nodes normal. No abnormal inguinal nodes palpated Neurologic: Grossly normal   Pelvic: External genitalia:  no lesions              Urethra:  normal appearing urethra with no masses, tenderness or lesions              Bartholins and Skenes: normal                 Vagina: normal appearing vagina with normal color and discharge, no lesions  Cervix: no lesions               Bimanual Exam:  Uterus:  normal size, contour, position, consistency, mobility, non-tender              Adnexa: no mass, fullness, tenderness               Rectovaginal: Confirms               Anus:  normal sphincter tone, no lesions  [name] chaperoned for the exam.  A:  Well Woman with normal exam  P:   Pap next year  Screening labs, vit D  Discussed breast self exam  Discussed calcium and vit D intake  Mammogram in the fall  Colonoscopy UTD, f/u in 5 years

## 2020-02-29 ENCOUNTER — Encounter: Payer: Self-pay | Admitting: Obstetrics and Gynecology

## 2020-02-29 ENCOUNTER — Ambulatory Visit: Payer: BC Managed Care – PPO | Admitting: Obstetrics and Gynecology

## 2020-02-29 ENCOUNTER — Other Ambulatory Visit: Payer: Self-pay

## 2020-02-29 VITALS — BP 124/68 | HR 66 | Ht 66.5 in | Wt 184.6 lb

## 2020-02-29 DIAGNOSIS — E559 Vitamin D deficiency, unspecified: Secondary | ICD-10-CM

## 2020-02-29 DIAGNOSIS — Z0000 Encounter for general adult medical examination without abnormal findings: Secondary | ICD-10-CM | POA: Diagnosis not present

## 2020-02-29 DIAGNOSIS — Z01419 Encounter for gynecological examination (general) (routine) without abnormal findings: Secondary | ICD-10-CM

## 2020-02-29 NOTE — Patient Instructions (Signed)

## 2020-03-01 LAB — COMPREHENSIVE METABOLIC PANEL
ALT: 28 IU/L (ref 0–32)
AST: 19 IU/L (ref 0–40)
Albumin/Globulin Ratio: 2.5 — ABNORMAL HIGH (ref 1.2–2.2)
Albumin: 4.8 g/dL (ref 3.8–4.8)
Alkaline Phosphatase: 86 IU/L (ref 48–121)
BUN/Creatinine Ratio: 24 (ref 12–28)
BUN: 18 mg/dL (ref 8–27)
Bilirubin Total: 0.6 mg/dL (ref 0.0–1.2)
CO2: 27 mmol/L (ref 20–29)
Calcium: 9.5 mg/dL (ref 8.7–10.3)
Chloride: 102 mmol/L (ref 96–106)
Creatinine, Ser: 0.74 mg/dL (ref 0.57–1.00)
GFR calc Af Amer: 100 mL/min/{1.73_m2} (ref >59)
GFR calc non Af Amer: 87 mL/min/{1.73_m2} (ref >59)
Globulin, Total: 1.9 g/dL (ref 1.5–4.5)
Glucose: 106 mg/dL — ABNORMAL HIGH (ref 65–99)
Potassium: 4.7 mmol/L (ref 3.5–5.2)
Sodium: 139 mmol/L (ref 134–144)
Total Protein: 6.7 g/dL (ref 6.0–8.5)

## 2020-03-01 LAB — VITAMIN D 25 HYDROXY (VIT D DEFICIENCY, FRACTURES): Vit D, 25-Hydroxy: 34.4 ng/mL (ref 30.0–100.0)

## 2020-03-01 LAB — LIPID PANEL
Chol/HDL Ratio: 3.5 ratio (ref 0.0–4.4)
Cholesterol, Total: 243 mg/dL — ABNORMAL HIGH (ref 100–199)
HDL: 70 mg/dL (ref >39)
LDL Chol Calc (NIH): 160 mg/dL — ABNORMAL HIGH (ref 0–99)
Triglycerides: 77 mg/dL (ref 0–149)
VLDL Cholesterol Cal: 13 mg/dL (ref 5–40)

## 2020-03-01 LAB — CBC
Hematocrit: 40.6 % (ref 34.0–46.6)
Hemoglobin: 13.9 g/dL (ref 11.1–15.9)
MCH: 30.8 pg (ref 26.6–33.0)
MCHC: 34.2 g/dL (ref 31.5–35.7)
MCV: 90 fL (ref 79–97)
Platelets: 286 10*3/uL (ref 150–450)
RBC: 4.51 x10E6/uL (ref 3.77–5.28)
RDW: 12.4 % (ref 11.7–15.4)
WBC: 7.2 10*3/uL (ref 3.4–10.8)

## 2020-03-05 ENCOUNTER — Other Ambulatory Visit: Payer: Self-pay

## 2020-03-05 DIAGNOSIS — R899 Unspecified abnormal finding in specimens from other organs, systems and tissues: Secondary | ICD-10-CM

## 2020-04-19 ENCOUNTER — Other Ambulatory Visit: Payer: Self-pay | Admitting: Obstetrics and Gynecology

## 2020-04-19 DIAGNOSIS — Z1231 Encounter for screening mammogram for malignant neoplasm of breast: Secondary | ICD-10-CM

## 2020-05-23 ENCOUNTER — Ambulatory Visit
Admission: RE | Admit: 2020-05-23 | Discharge: 2020-05-23 | Disposition: A | Discharge status: Home / Self Care | Payer: BC Managed Care – PPO | Source: Ambulatory Visit | Attending: Obstetrics and Gynecology | Admitting: Obstetrics and Gynecology

## 2020-05-23 ENCOUNTER — Other Ambulatory Visit: Payer: Self-pay

## 2020-05-23 DIAGNOSIS — Z1231 Encounter for screening mammogram for malignant neoplasm of breast: Secondary | ICD-10-CM

## 2020-08-19 ENCOUNTER — Other Ambulatory Visit: Payer: Self-pay

## 2020-08-19 ENCOUNTER — Other Ambulatory Visit (INDEPENDENT_AMBULATORY_CARE_PROVIDER_SITE_OTHER): Payer: BC Managed Care – PPO

## 2020-08-19 DIAGNOSIS — R899 Unspecified abnormal finding in specimens from other organs, systems and tissues: Secondary | ICD-10-CM

## 2020-08-20 LAB — LIPID PANEL
Chol/HDL Ratio: 3.2 ratio (ref 0.0–4.4)
Cholesterol, Total: 238 mg/dL — ABNORMAL HIGH (ref 100–199)
HDL: 75 mg/dL (ref >39)
LDL Chol Calc (NIH): 150 mg/dL — ABNORMAL HIGH (ref 0–99)
Triglycerides: 78 mg/dL (ref 0–149)
VLDL Cholesterol Cal: 13 mg/dL (ref 5–40)

## 2021-03-11 NOTE — Progress Notes (Addendum)
65 y.o. [id number] Married White or Caucasian Not Hispanic or Latino female here for annual exam.  No vaginal bleeding.  Mild GSI, tolerable. No bowel c/o.  Occasionally sexually active, no pain.     Patient's last menstrual period was 02/11/2011.          Sexually active: Yes.    The current method of family planning is cervical cap and post menopausal status.    Exercising: Yes.     Walking 3-4 times a week  Smoker:  no  Health Maintenance: Pap:  01/28/18 Neg no hpv done, 01/16/16 WNL Hr Hpv Neg  History of abnormal Pap:  no  MMG:  05/28/20 density C Bi-rads 1 neg  BMD:   04/14/2018 Normal  Colonoscopy: 05/29/19, polyps benign, f/u in 5 years (Sister with colon cancer). 2013 neg f/u 10 years  TDaP:  01/28/18 Gardasil: NA   reports that she has never smoked. She has never used smokeless tobacco. She reports current alcohol use of about 3.0 standard drinks per week. 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 3 grown daughters. Oldest daughter has 3 boys, middle daughter is getting married in 3 weeks, youngest is teaching.   Past Medical History:  Diagnosis Date   SUI (stress urinary incontinence, female)     History reviewed. No pertinent surgical history.  Current Outpatient Medications  Medication Sig Dispense Refill   Cholecalciferol (VITAMIN D PO) Take by mouth.     Multiple Vitamins-Minerals (MULTIVITAMIN PO) Take by mouth as needed.      Omega-3 Fatty Acids (FISH OIL PO) Take by mouth.     No current facility-administered medications for this visit.    Family History  Problem Relation Age of Onset   Hyperlipidemia Mother    Cancer Sister        appendix cancer     Review of Systems  All other systems reviewed and are negative.  Exam:   BP 132/70   Pulse (!) 58   Ht 5' 6.75" (1.695 m)   Wt 180 lb (81.6 kg)   LMP 02/11/2011   SpO2 97%   BMI 28.40 kg/m    Weight change: @WEIGHTCHANGE @ Height:   Height: 5' 6.75" (169.5 cm)  Ht Readings from Last 3 Encounters:  03/12/21 5' 6.75" (1.695 m)  02/29/20 5' 6.5" (1.689 m)  02/20/19 5' 6.75" (1.695 m)    General appearance: alert, cooperative and appears stated age Head: Normocephalic, without obvious abnormality, atraumatic Neck: no adenopathy, supple, symmetrical, trachea midline and thyroid normal to inspection and palpation Lungs: clear to auscultation bilaterally Cardiovascular: regular rate and rhythm Breasts: normal appearance, no masses or tenderness Abdomen: soft, non-tender; non distended,  no masses,  no organomegaly Extremities: extremities normal, atraumatic, no cyanosis or edema Skin: Skin color, texture, turgor normal. No rashes or lesions Lymph nodes: Cervical, supraclavicular, and axillary nodes normal. No abnormal inguinal nodes palpated Neurologic: Grossly normal   Pelvic: External genitalia:  no lesions              Urethra:  normal appearing urethra with no masses, tenderness or lesions              Bartholins and Skenes: normal                 Vagina: atrophic appearing vagina with normal color and discharge, no lesions  Cervix: no lesions               Bimanual Exam:  Uterus:  normal size, contour, position, consistency, mobility, non-tender              Adnexa: no mass, fullness, tenderness               Rectovaginal: Confirms               Anus:  normal sphincter tone, no lesions   1. Well woman exam Discussed breast self exam Discussed calcium and vit D intake Mammogram in 11/22 Colonoscopy UTD  2. Screening for cervical cancer - Cytology - PAP  3. Laboratory exam ordered as part of routine general medical examination - CBC - Comprehensive metabolic panel - Lipid panel  4. Vitamin D deficiency - VITAMIN D 25 Hydroxy (Vit-D Deficiency, Fractures)  Addendum: left nipple inverted, no change.

## 2021-03-12 ENCOUNTER — Other Ambulatory Visit: Payer: Self-pay

## 2021-03-12 ENCOUNTER — Other Ambulatory Visit (HOSPITAL_COMMUNITY)
Admission: RE | Admit: 2021-03-12 | Discharge: 2021-03-12 | Disposition: A | Discharge status: Home / Self Care | Payer: BC Managed Care – PPO | Source: Ambulatory Visit | Attending: Obstetrics and Gynecology | Admitting: Obstetrics and Gynecology

## 2021-03-12 ENCOUNTER — Encounter: Payer: Self-pay | Admitting: Obstetrics and Gynecology

## 2021-03-12 ENCOUNTER — Ambulatory Visit (INDEPENDENT_AMBULATORY_CARE_PROVIDER_SITE_OTHER): Payer: BC Managed Care – PPO | Admitting: Obstetrics and Gynecology

## 2021-03-12 VITALS — BP 132/70 | HR 58 | Ht 66.75 in | Wt 180.0 lb

## 2021-03-12 DIAGNOSIS — Z124 Encounter for screening for malignant neoplasm of cervix: Secondary | ICD-10-CM | POA: Diagnosis present

## 2021-03-12 DIAGNOSIS — E559 Vitamin D deficiency, unspecified: Secondary | ICD-10-CM | POA: Diagnosis not present

## 2021-03-12 DIAGNOSIS — Z01419 Encounter for gynecological examination (general) (routine) without abnormal findings: Secondary | ICD-10-CM | POA: Diagnosis not present

## 2021-03-12 DIAGNOSIS — Z0000 Encounter for general adult medical examination without abnormal findings: Secondary | ICD-10-CM

## 2021-03-12 NOTE — Patient Instructions (Signed)

## 2021-03-13 LAB — CYTOLOGY - PAP
Comment: NEGATIVE
Diagnosis: NEGATIVE
High risk HPV: NEGATIVE

## 2021-03-15 LAB — COMPREHENSIVE METABOLIC PANEL
AG Ratio: 2.1 (calc) (ref 1.0–2.5)
ALT: 20 U/L (ref 6–29)
AST: 17 U/L (ref 10–35)
Albumin: 4.5 g/dL (ref 3.6–5.1)
Alkaline phosphatase (APISO): 75 U/L (ref 37–153)
BUN: 15 mg/dL (ref 7–25)
CO2: 29 mmol/L (ref 20–32)
Calcium: 9.8 mg/dL (ref 8.6–10.4)
Chloride: 105 mmol/L (ref 98–110)
Creat: 0.76 mg/dL (ref 0.50–1.05)
Globulin: 2.1 g/dL (calc) (ref 1.9–3.7)
Glucose, Bld: 101 mg/dL — ABNORMAL HIGH (ref 65–99)
Potassium: 4.9 mmol/L (ref 3.5–5.3)
Sodium: 141 mmol/L (ref 135–146)
Total Bilirubin: 0.7 mg/dL (ref 0.2–1.2)
Total Protein: 6.6 g/dL (ref 6.1–8.1)

## 2021-03-15 LAB — LIPID PANEL
Cholesterol: 261 mg/dL — ABNORMAL HIGH (ref <200)
HDL: 71 mg/dL (ref >=50)
LDL Cholesterol (Calc): 164 mg/dL (calc) — ABNORMAL HIGH
Non-HDL Cholesterol (Calc): 190 mg/dL (calc) — ABNORMAL HIGH (ref <130)
Total CHOL/HDL Ratio: 3.7 (calc) (ref <5.0)
Triglycerides: 125 mg/dL (ref <150)

## 2021-03-15 LAB — CBC
HCT: 42 % (ref 35.0–45.0)
Hemoglobin: 14.3 g/dL (ref 11.7–15.5)
MCH: 30.6 pg (ref 27.0–33.0)
MCHC: 34 g/dL (ref 32.0–36.0)
MCV: 89.7 fL (ref 80.0–100.0)
MPV: 10.1 fL (ref 7.5–12.5)
Platelets: 283 10*3/uL (ref 140–400)
RBC: 4.68 10*6/uL (ref 3.80–5.10)
RDW: 12.6 % (ref 11.0–15.0)
WBC: 6.2 10*3/uL (ref 3.8–10.8)

## 2021-03-15 LAB — HEMOGLOBIN A1C
Hgb A1c MFr Bld: 5.4 % of total Hgb (ref <5.7)
Mean Plasma Glucose: 108 mg/dL
eAG (mmol/L): 6 mmol/L

## 2021-03-15 LAB — VITAMIN D 25 HYDROXY (VIT D DEFICIENCY, FRACTURES): Vit D, 25-Hydroxy: 34 ng/mL (ref 30–100)

## 2021-04-22 ENCOUNTER — Other Ambulatory Visit: Payer: Self-pay | Admitting: Obstetrics and Gynecology

## 2021-04-22 DIAGNOSIS — Z1231 Encounter for screening mammogram for malignant neoplasm of breast: Secondary | ICD-10-CM

## 2021-05-26 ENCOUNTER — Ambulatory Visit
Admission: RE | Admit: 2021-05-26 | Discharge: 2021-05-26 | Disposition: A | Discharge status: Home / Self Care | Payer: BC Managed Care – PPO | Source: Ambulatory Visit | Attending: Obstetrics and Gynecology | Admitting: Obstetrics and Gynecology

## 2021-05-26 DIAGNOSIS — Z1231 Encounter for screening mammogram for malignant neoplasm of breast: Secondary | ICD-10-CM

## 2021-05-28 ENCOUNTER — Other Ambulatory Visit: Payer: Self-pay | Admitting: Obstetrics and Gynecology

## 2021-05-28 DIAGNOSIS — R928 Other abnormal and inconclusive findings on diagnostic imaging of breast: Secondary | ICD-10-CM

## 2021-07-01 ENCOUNTER — Ambulatory Visit
Admission: RE | Admit: 2021-07-01 | Discharge: 2021-07-01 | Disposition: A | Discharge status: Home / Self Care | Payer: BC Managed Care – PPO | Source: Ambulatory Visit | Attending: Obstetrics and Gynecology | Admitting: Obstetrics and Gynecology

## 2021-07-01 DIAGNOSIS — R928 Other abnormal and inconclusive findings on diagnostic imaging of breast: Secondary | ICD-10-CM

## 2022-03-09 NOTE — Progress Notes (Signed)
66 y.o. [id number] Married White or Caucasian Not Hispanic or Latino female here for annual exam. No vaginal bleeding. Rarely sexually active, no pain.   H/O GSI, tolerable.  No bowel c/o.   Patient is requesting labs.      Patient's last menstrual period was 02/11/2011.          Sexually active: No.  The current method of family planning is status post hysterectomy.    Exercising: Yes.     Walking and weights  Smoker:  no  Health Maintenance: Pap:  03/12/21 WNL, neg hpv; 01/28/18 Neg no hpv done, 01/16/16 WNL Hr Hpv Neg  History of abnormal Pap:  no MMG:  07/01/21 density C Bi-rads 1 benign  BMD:   04/14/2018 Normal  Colonoscopy: 05/29/19, polyps benign, f/u in 5 years (Sister with colon cancer). 2013 neg  TDaP:  01/28/18 Gardasil: n/a   reports that she has never smoked. She has never used smokeless tobacco. She reports current alcohol use of about 3.0 standard drinks of alcohol per week. She reports that she does not use drugs. She is working part time in preschool and wedding planning. 3 daughters, 2 are married and 1 is engaged. 3 grandsons.   Past Medical History:  Diagnosis Date   SUI (stress urinary incontinence, female)     History reviewed. No pertinent surgical history.  Current Outpatient Medications  Medication Sig Dispense Refill   Cholecalciferol (VITAMIN D PO) Take by mouth.     magnesium 30 MG tablet Take 30 mg by mouth 2 (two) times daily.     Multiple Vitamins-Minerals (MULTIVITAMIN PO) Take by mouth as needed.      No current facility-administered medications for this visit.    Family History  Problem Relation Age of Onset   Hyperlipidemia Mother    Cancer Sister        appendix cancer    Breast cancer Neg Hx     Review of Systems  All other systems reviewed and are negative.   Exam:   BP 132/82   Pulse 77   Ht 5' 6.75" (1.695 m)   Wt 179 lb (81.2 kg)   LMP 02/11/2011   SpO2 100%   BMI 28.25 kg/m   Weight change: @WEIGHTCHANGE @ Height:    Height: 5' 6.75" (169.5 cm)  Ht Readings from Last 3 Encounters:  03/19/22 5' 6.75" (1.695 m)  03/12/21 5' 6.75" (1.695 m)  02/29/20 5' 6.5" (1.689 m)    General appearance: alert, cooperative and appears stated age Head: Normocephalic, without obvious abnormality, atraumatic Neck: no adenopathy, supple, symmetrical, trachea midline and thyroid normal to inspection and palpation Lungs: clear to auscultation bilaterally Cardiovascular: regular rate and rhythm Breasts: normal appearance, no masses or tenderness, left nipple inverted (no change) Abdomen: soft, non-tender; non distended,  no masses,  no organomegaly Extremities: extremities normal, atraumatic, no cyanosis or edema Skin: Skin color, texture, turgor normal. No rashes or lesions Lymph nodes: Cervical, supraclavicular, and axillary nodes normal. No abnormal inguinal nodes palpated Neurologic: Grossly normal   Pelvic: External genitalia:  no lesions              Urethra:  normal appearing urethra with no masses, tenderness or lesions              Bartholins and Skenes: normal                 Vagina: normal appearing vagina with normal color and discharge, no lesions  Cervix: no lesions               Bimanual Exam:  Uterus:   no masses or tenderness              Adnexa: no mass, fullness, tenderness               Rectovaginal: Confirms               Anus:  normal sphincter tone, no lesions  Carolynn Serve chaperoned for the exam.  1. Well woman exam Discussed breast self exam Discussed calcium and vit D intake No pap this year Mammogram in 12/23 Colonoscopy UTD  2. Lipids abnormal Fasting - Lipid panel  3. Elevated glucose - Hemoglobin A1c

## 2022-03-19 ENCOUNTER — Ambulatory Visit (INDEPENDENT_AMBULATORY_CARE_PROVIDER_SITE_OTHER): Payer: BC Managed Care – PPO | Admitting: Obstetrics and Gynecology

## 2022-03-19 ENCOUNTER — Ambulatory Visit: Payer: BC Managed Care – PPO | Admitting: Obstetrics and Gynecology

## 2022-03-19 ENCOUNTER — Encounter: Payer: Self-pay | Admitting: Obstetrics and Gynecology

## 2022-03-19 VITALS — BP 132/82 | HR 77 | Ht 66.75 in | Wt 179.0 lb

## 2022-03-19 DIAGNOSIS — Z01419 Encounter for gynecological examination (general) (routine) without abnormal findings: Secondary | ICD-10-CM | POA: Diagnosis not present

## 2022-03-19 DIAGNOSIS — R7309 Other abnormal glucose: Secondary | ICD-10-CM | POA: Diagnosis not present

## 2022-03-19 DIAGNOSIS — E7889 Other lipoprotein metabolism disorders: Secondary | ICD-10-CM

## 2022-03-19 DIAGNOSIS — Z800 Family history of malignant neoplasm of digestive organs: Secondary | ICD-10-CM | POA: Insufficient documentation

## 2022-03-19 DIAGNOSIS — Z1211 Encounter for screening for malignant neoplasm of colon: Secondary | ICD-10-CM | POA: Insufficient documentation

## 2022-03-19 NOTE — Patient Instructions (Signed)

## 2022-03-20 LAB — LIPID PANEL
Cholesterol: 262 mg/dL — ABNORMAL HIGH (ref <200)
HDL: 69 mg/dL (ref >=50)
LDL Cholesterol (Calc): 167 mg/dL (calc) — ABNORMAL HIGH
Non-HDL Cholesterol (Calc): 193 mg/dL (calc) — ABNORMAL HIGH (ref <130)
Total CHOL/HDL Ratio: 3.8 (calc) (ref <5.0)
Triglycerides: 128 mg/dL (ref <150)

## 2022-03-20 LAB — HEMOGLOBIN A1C
Hgb A1c MFr Bld: 5.4 % of total Hgb (ref <5.7)
Mean Plasma Glucose: 108 mg/dL
eAG (mmol/L): 6 mmol/L

## 2022-06-15 ENCOUNTER — Other Ambulatory Visit: Payer: Self-pay | Admitting: Obstetrics and Gynecology

## 2022-06-15 DIAGNOSIS — Z1231 Encounter for screening mammogram for malignant neoplasm of breast: Secondary | ICD-10-CM

## 2022-08-07 ENCOUNTER — Ambulatory Visit
Admission: RE | Admit: 2022-08-07 | Discharge: 2022-08-07 | Disposition: A | Discharge status: Home / Self Care | Payer: BC Managed Care – PPO | Source: Ambulatory Visit | Attending: Obstetrics and Gynecology | Admitting: Obstetrics and Gynecology

## 2022-08-07 DIAGNOSIS — Z1231 Encounter for screening mammogram for malignant neoplasm of breast: Secondary | ICD-10-CM

## 2023-03-25 ENCOUNTER — Ambulatory Visit: Payer: BC Managed Care – PPO | Admitting: Obstetrics and Gynecology

## 2023-06-23 DIAGNOSIS — E782 Mixed hyperlipidemia: Secondary | ICD-10-CM | POA: Diagnosis not present

## 2023-07-09 ENCOUNTER — Other Ambulatory Visit: Payer: Self-pay | Admitting: Family Medicine

## 2023-07-09 DIAGNOSIS — Z1231 Encounter for screening mammogram for malignant neoplasm of breast: Secondary | ICD-10-CM

## 2023-07-18 DIAGNOSIS — R1031 Right lower quadrant pain: Secondary | ICD-10-CM | POA: Diagnosis not present

## 2023-07-19 DIAGNOSIS — R109 Unspecified abdominal pain: Secondary | ICD-10-CM | POA: Diagnosis not present

## 2023-07-19 DIAGNOSIS — R1031 Right lower quadrant pain: Secondary | ICD-10-CM | POA: Diagnosis not present

## 2023-07-26 DIAGNOSIS — R109 Unspecified abdominal pain: Secondary | ICD-10-CM | POA: Diagnosis not present

## 2023-07-26 DIAGNOSIS — B029 Zoster without complications: Secondary | ICD-10-CM | POA: Diagnosis not present

## 2023-08-10 ENCOUNTER — Ambulatory Visit: Payer: BC Managed Care – PPO

## 2023-08-11 ENCOUNTER — Ambulatory Visit
Admission: RE | Admit: 2023-08-11 | Discharge: 2023-08-11 | Disposition: A | Discharge status: Home / Self Care | Payer: PPO | Source: Ambulatory Visit | Attending: Family Medicine | Admitting: Family Medicine

## 2023-08-11 DIAGNOSIS — Z1231 Encounter for screening mammogram for malignant neoplasm of breast: Secondary | ICD-10-CM

## 2023-09-15 DIAGNOSIS — Z13220 Encounter for screening for lipoid disorders: Secondary | ICD-10-CM | POA: Diagnosis not present

## 2023-09-15 DIAGNOSIS — Z0000 Encounter for general adult medical examination without abnormal findings: Secondary | ICD-10-CM | POA: Diagnosis not present

## 2023-09-21 DIAGNOSIS — Z23 Encounter for immunization: Secondary | ICD-10-CM | POA: Diagnosis not present

## 2023-09-21 DIAGNOSIS — Z0000 Encounter for general adult medical examination without abnormal findings: Secondary | ICD-10-CM | POA: Diagnosis not present

## 2023-09-22 ENCOUNTER — Other Ambulatory Visit: Payer: Self-pay | Admitting: Family Medicine

## 2023-09-22 DIAGNOSIS — E2839 Other primary ovarian failure: Secondary | ICD-10-CM

## 2023-11-04 DIAGNOSIS — L821 Other seborrheic keratosis: Secondary | ICD-10-CM | POA: Diagnosis not present

## 2023-11-04 DIAGNOSIS — L738 Other specified follicular disorders: Secondary | ICD-10-CM | POA: Diagnosis not present

## 2023-11-04 DIAGNOSIS — D229 Melanocytic nevi, unspecified: Secondary | ICD-10-CM | POA: Diagnosis not present

## 2023-11-04 DIAGNOSIS — L814 Other melanin hyperpigmentation: Secondary | ICD-10-CM | POA: Diagnosis not present

## 2023-11-04 DIAGNOSIS — L578 Other skin changes due to chronic exposure to nonionizing radiation: Secondary | ICD-10-CM | POA: Diagnosis not present

## 2023-11-04 DIAGNOSIS — D1801 Hemangioma of skin and subcutaneous tissue: Secondary | ICD-10-CM | POA: Diagnosis not present

## 2024-01-05 DIAGNOSIS — R739 Hyperglycemia, unspecified: Secondary | ICD-10-CM | POA: Diagnosis not present

## 2024-01-05 DIAGNOSIS — E785 Hyperlipidemia, unspecified: Secondary | ICD-10-CM | POA: Diagnosis not present

## 2024-01-05 DIAGNOSIS — E559 Vitamin D deficiency, unspecified: Secondary | ICD-10-CM | POA: Diagnosis not present

## 2024-01-05 DIAGNOSIS — R5383 Other fatigue: Secondary | ICD-10-CM | POA: Diagnosis not present

## 2024-01-12 ENCOUNTER — Other Ambulatory Visit: Payer: Self-pay | Admitting: Family Medicine

## 2024-01-12 DIAGNOSIS — R7303 Prediabetes: Secondary | ICD-10-CM | POA: Diagnosis not present

## 2024-01-12 DIAGNOSIS — Z0000 Encounter for general adult medical examination without abnormal findings: Secondary | ICD-10-CM | POA: Diagnosis not present

## 2024-01-12 DIAGNOSIS — E559 Vitamin D deficiency, unspecified: Secondary | ICD-10-CM | POA: Diagnosis not present

## 2024-01-12 DIAGNOSIS — K635 Polyp of colon: Secondary | ICD-10-CM | POA: Diagnosis not present

## 2024-01-12 DIAGNOSIS — I10 Essential (primary) hypertension: Secondary | ICD-10-CM | POA: Diagnosis not present

## 2024-01-12 DIAGNOSIS — Z789 Other specified health status: Secondary | ICD-10-CM | POA: Diagnosis not present

## 2024-01-12 DIAGNOSIS — E782 Mixed hyperlipidemia: Secondary | ICD-10-CM | POA: Diagnosis not present

## 2024-01-12 DIAGNOSIS — E785 Hyperlipidemia, unspecified: Secondary | ICD-10-CM

## 2024-01-12 DIAGNOSIS — Z1331 Encounter for screening for depression: Secondary | ICD-10-CM | POA: Diagnosis not present

## 2024-01-12 DIAGNOSIS — Z1339 Encounter for screening examination for other mental health and behavioral disorders: Secondary | ICD-10-CM | POA: Diagnosis not present

## 2024-01-19 ENCOUNTER — Other Ambulatory Visit (HOSPITAL_BASED_OUTPATIENT_CLINIC_OR_DEPARTMENT_OTHER): Payer: Self-pay | Admitting: Family Medicine

## 2024-01-19 DIAGNOSIS — E782 Mixed hyperlipidemia: Secondary | ICD-10-CM

## 2024-01-24 ENCOUNTER — Other Ambulatory Visit

## 2024-01-26 DIAGNOSIS — E2839 Other primary ovarian failure: Secondary | ICD-10-CM | POA: Diagnosis not present

## 2024-01-26 DIAGNOSIS — N958 Other specified menopausal and perimenopausal disorders: Secondary | ICD-10-CM | POA: Diagnosis not present

## 2024-02-10 ENCOUNTER — Ambulatory Visit (HOSPITAL_BASED_OUTPATIENT_CLINIC_OR_DEPARTMENT_OTHER)
Admission: RE | Admit: 2024-02-10 | Discharge: 2024-02-10 | Disposition: A | Discharge status: Home / Self Care | Payer: Self-pay | Source: Ambulatory Visit | Attending: Family Medicine | Admitting: Family Medicine

## 2024-02-10 DIAGNOSIS — E782 Mixed hyperlipidemia: Secondary | ICD-10-CM | POA: Insufficient documentation

## 2024-02-15 DIAGNOSIS — Z1211 Encounter for screening for malignant neoplasm of colon: Secondary | ICD-10-CM | POA: Diagnosis not present

## 2024-02-15 DIAGNOSIS — Z860100 Personal history of colon polyps, unspecified: Secondary | ICD-10-CM | POA: Diagnosis not present

## 2024-02-15 DIAGNOSIS — I10 Essential (primary) hypertension: Secondary | ICD-10-CM | POA: Diagnosis not present

## 2024-02-15 DIAGNOSIS — Z800 Family history of malignant neoplasm of digestive organs: Secondary | ICD-10-CM | POA: Diagnosis not present

## 2024-02-21 DIAGNOSIS — E1165 Type 2 diabetes mellitus with hyperglycemia: Secondary | ICD-10-CM | POA: Diagnosis not present

## 2024-02-21 DIAGNOSIS — E559 Vitamin D deficiency, unspecified: Secondary | ICD-10-CM | POA: Diagnosis not present

## 2024-02-21 DIAGNOSIS — I10 Essential (primary) hypertension: Secondary | ICD-10-CM | POA: Diagnosis not present

## 2024-02-21 DIAGNOSIS — E782 Mixed hyperlipidemia: Secondary | ICD-10-CM | POA: Diagnosis not present

## 2024-04-25 DIAGNOSIS — E782 Mixed hyperlipidemia: Secondary | ICD-10-CM | POA: Diagnosis not present

## 2024-04-25 DIAGNOSIS — R7303 Prediabetes: Secondary | ICD-10-CM | POA: Diagnosis not present

## 2024-04-25 DIAGNOSIS — E559 Vitamin D deficiency, unspecified: Secondary | ICD-10-CM | POA: Diagnosis not present

## 2024-04-25 DIAGNOSIS — I10 Essential (primary) hypertension: Secondary | ICD-10-CM | POA: Diagnosis not present

## 2024-05-02 DIAGNOSIS — E559 Vitamin D deficiency, unspecified: Secondary | ICD-10-CM | POA: Diagnosis not present

## 2024-05-02 DIAGNOSIS — Z1211 Encounter for screening for malignant neoplasm of colon: Secondary | ICD-10-CM | POA: Diagnosis not present

## 2024-05-02 DIAGNOSIS — E782 Mixed hyperlipidemia: Secondary | ICD-10-CM | POA: Diagnosis not present

## 2024-05-02 DIAGNOSIS — Z789 Other specified health status: Secondary | ICD-10-CM | POA: Diagnosis not present

## 2024-05-02 DIAGNOSIS — Z79899 Other long term (current) drug therapy: Secondary | ICD-10-CM | POA: Diagnosis not present

## 2024-05-02 DIAGNOSIS — Z23 Encounter for immunization: Secondary | ICD-10-CM | POA: Diagnosis not present

## 2024-05-02 DIAGNOSIS — I10 Essential (primary) hypertension: Secondary | ICD-10-CM | POA: Diagnosis not present

## 2024-05-02 DIAGNOSIS — R7303 Prediabetes: Secondary | ICD-10-CM | POA: Diagnosis not present

## 2024-05-23 ENCOUNTER — Other Ambulatory Visit

## 2024-07-19 ENCOUNTER — Other Ambulatory Visit: Payer: Self-pay | Admitting: Family Medicine

## 2024-07-19 DIAGNOSIS — Z1231 Encounter for screening mammogram for malignant neoplasm of breast: Secondary | ICD-10-CM

## 2024-08-07 ENCOUNTER — Inpatient Hospital Stay (HOSPITAL_BASED_OUTPATIENT_CLINIC_OR_DEPARTMENT_OTHER): Admission: RE | Admit: 2024-08-07 | Source: Ambulatory Visit

## 2024-08-15 ENCOUNTER — Ambulatory Visit
Admission: RE | Admit: 2024-08-15 | Discharge: 2024-08-15 | Disposition: A | Source: Ambulatory Visit | Attending: Family Medicine | Admitting: Family Medicine

## 2024-08-15 DIAGNOSIS — Z1231 Encounter for screening mammogram for malignant neoplasm of breast: Secondary | ICD-10-CM
